# Patient Record
Sex: Male | Born: 1940 | Race: White | Hispanic: No | Marital: Single | Smoking: Current some day smoker
Health system: Southern US, Community
[De-identification: ages and names within clinical notes are randomized; demographics above are authoritative.]

## PROBLEM LIST (undated history)

## (undated) DIAGNOSIS — M1990 Unspecified osteoarthritis, unspecified site: Secondary | ICD-10-CM

## (undated) DIAGNOSIS — Z951 Presence of aortocoronary bypass graft: Secondary | ICD-10-CM

## (undated) DIAGNOSIS — Z9889 Other specified postprocedural states: Secondary | ICD-10-CM

## (undated) DIAGNOSIS — IMO0001 Reserved for inherently not codable concepts without codable children: Secondary | ICD-10-CM

## (undated) DIAGNOSIS — I639 Cerebral infarction, unspecified: Secondary | ICD-10-CM

## (undated) DIAGNOSIS — E785 Hyperlipidemia, unspecified: Secondary | ICD-10-CM

## (undated) DIAGNOSIS — K219 Gastro-esophageal reflux disease without esophagitis: Secondary | ICD-10-CM

## (undated) DIAGNOSIS — R0989 Other specified symptoms and signs involving the circulatory and respiratory systems: Secondary | ICD-10-CM

## (undated) DIAGNOSIS — I509 Heart failure, unspecified: Secondary | ICD-10-CM

## (undated) DIAGNOSIS — I10 Essential (primary) hypertension: Secondary | ICD-10-CM

## (undated) HISTORY — PX: TONSILLECTOMY: SUR1361

## (undated) HISTORY — DX: Essential (primary) hypertension: I10

## (undated) HISTORY — PX: HERNIA REPAIR: SHX51

## (undated) HISTORY — DX: Gastro-esophageal reflux disease without esophagitis: K21.9

## (undated) HISTORY — PX: MOUTH SURGERY: SHX715

## (undated) HISTORY — DX: Other specified symptoms and signs involving the circulatory and respiratory systems: R09.89

## (undated) HISTORY — DX: Hyperlipidemia, unspecified: E78.5

---

## 2005-01-24 ENCOUNTER — Emergency Department (HOSPITAL_COMMUNITY): Admission: EM | Admit: 2005-01-24 | Discharge: 2005-01-24 | Payer: Self-pay | Admitting: Emergency Medicine

## 2005-04-23 ENCOUNTER — Encounter: Admission: RE | Admit: 2005-04-23 | Discharge: 2005-04-23 | Payer: Self-pay | Admitting: Family Medicine

## 2005-04-28 ENCOUNTER — Encounter: Payer: Self-pay | Admitting: Emergency Medicine

## 2005-04-28 ENCOUNTER — Ambulatory Visit: Payer: Self-pay | Admitting: Physical Medicine & Rehabilitation

## 2005-04-28 ENCOUNTER — Inpatient Hospital Stay (HOSPITAL_COMMUNITY): Admission: EM | Admit: 2005-04-28 | Discharge: 2005-05-04 | Payer: Self-pay | Admitting: Emergency Medicine

## 2005-05-03 ENCOUNTER — Encounter (INDEPENDENT_AMBULATORY_CARE_PROVIDER_SITE_OTHER): Payer: Self-pay | Admitting: Cardiology

## 2006-03-18 ENCOUNTER — Emergency Department (HOSPITAL_COMMUNITY): Admission: EM | Admit: 2006-03-18 | Discharge: 2006-03-19 | Payer: Self-pay | Admitting: Emergency Medicine

## 2007-01-24 ENCOUNTER — Emergency Department (HOSPITAL_COMMUNITY): Admission: EM | Admit: 2007-01-24 | Discharge: 2007-01-24 | Payer: Self-pay | Admitting: Emergency Medicine

## 2007-06-16 ENCOUNTER — Inpatient Hospital Stay (HOSPITAL_COMMUNITY): Admission: EM | Admit: 2007-06-16 | Discharge: 2007-06-19 | Payer: Self-pay | Admitting: Emergency Medicine

## 2007-06-19 ENCOUNTER — Encounter (INDEPENDENT_AMBULATORY_CARE_PROVIDER_SITE_OTHER): Payer: Self-pay | Admitting: Internal Medicine

## 2007-08-11 ENCOUNTER — Emergency Department (HOSPITAL_COMMUNITY): Admission: EM | Admit: 2007-08-11 | Discharge: 2007-08-11 | Payer: Self-pay | Admitting: Emergency Medicine

## 2007-10-26 ENCOUNTER — Ambulatory Visit: Payer: Self-pay | Admitting: Internal Medicine

## 2007-11-13 ENCOUNTER — Ambulatory Visit: Payer: Self-pay

## 2010-11-10 NOTE — Letter (Signed)
October 26, 2007    Jethro Bastos, M.D.  [address]  [address]  [place], [place] [address]   RE:  Darren [name], Darren [name]  MRN:  [id number]  /  DOB:  April 24, 1941   Dear Nadine Counts:   I look forward to talking with you about Darren [name] and I trust I  will have done so by the time you get this letter.   It was a pleasure to see him today at your request because of recurrent  falls and syncope.   As you know, he is a 70 year old Caucasian male who has had a number of  strokes in the past, who has no known heart disease including a negative  echo from the fall of 2008, notable primarily for mild diastolic  dysfunction.   Over the last 2 or 3 years, he has had recurrent episodes that are quite  stereotypical.  They result in him falling, though he is rather adamant  that he does not lose consciousness.  He is unable to get up off the  ground for some time.  On each occasion, EMS has been called and his  blood pressure has been too low for a reading.  On one of these  occasions, we have the admission blood pressure from the hospital, which  was in the 80s; other ER blood pressures have been in the 120-160  systolic range.   The stereotypical prodrome is characterized primarily by funny in the  head.  It is accompanied by diaphoresis and flushing and some nausea.  It can last for minutes prior to his falling and it has never occurred  without him subsequently falling.  He is quite pale with these episodes  and as mentioned, he has residual orthostatic intolerance.   It is also interesting that the family recalls nothing from the EMS  reports.  There are no comment about an abnormal heart rate.   He does not have baseline orthostatic intolerance.  He does not have  shower intolerance.   He does have a significant problem with blood pressure over the years  and you have gradually down-titrated his blood pressure medications so  that they are now off and his blood pressures have been  remaining in  fairly good control.  He notes that he has had no recurrent symptoms in  3 months and thinks that this is largely attributable to your medication  adjustments.   As noted in your note, he has been treated for dehydration, although  there is no apparent reason for him to have been dehydrated.   PAST MEDICAL HISTORY:  Notable for the multiple strokes for which there  has been no specific diagnosis made and no cause identified.   In addition, his past medical history is notable for: (1) Kidney  problems, (2) urinary problems, (3) psoriasis, (4) disordered gait  attributed to his prior strokes.   PAST SURGICAL HISTORY:  Notable for herniorrhaphy x2 and tonsillectomy.   REVIEW OF SYSTEMS:  Otherwise broadly negative.   SOCIAL HISTORY:  He is a widower.  He has 2 children.  He does not use  cigarettes, having stopped a month ago.  He does not use alcohol or  recreational drugs.   He is retired from the [employer].  He also I should  note is not using caffeine.   MEDICATIONS:  1. Aspirin 81.  2. Ranitidine 300.  3. Simvastatin 40.   ALLERGIES:  He has no known drug allergies.  PHYSICAL EXAMINATION:  He is an older Caucasian male appearing  considerably older than his 66 years.  He was not orthostatic in the  office today with blood pressure of 109/80 with a pulse of 69 lying and  then the blood pressures actually went up to 132/85 with a pulse of 76  at 2 minutes with a subsequent fall to 111/79 with a pulse of 76 at 5  minutes.  The HEENT exam demonstrated no icterus or xanthomata.  There  was maybe a soft bruit on the right.  The back was without kyphosis or  scoliosis.  His lung sounds were markedly diminished.  There were no  rales.  Heart sounds were somewhat distant.  There was an S4.  There was  also a 2/6 murmur heard along the sternum.  The abdomen was soft with  active bowel sounds.  There was a midline pulsation that was mildly  broad.   There was no hepatomegaly.  Femoral pulses were 2+ without  bruits.  Distal pulses were trace.  There was no clubbing, cyanosis or  edema.  The neurological exam was notable for his wide-based gait, but  was otherwise normal.   His electrocardiogram dated from August 11, 2007 demonstrated sinus  rhythm at 58 with intervals of 0.15/0.08/0.42.  Another  electrocardiogram was not taken today.   IMPRESSION:  1. Recurrent falls plus or minus loss of consciousness associated with      documented recurrent low blood pressure without any evidence of      arrhythmia.  2. Prior strokes, question mechanism.  3. Peripheral vascular disease suggested by broad abdominal pulsation      and a right carotid bruit.  4. Modest exercise intolerance, presumably related to chronic      obstructive pulmonary disease.   Nadine Counts, Darren [name] has recurrent spells with a stereotypical prodrome  that are associated with hypotension and without evidence of a  persisting rhythm abnormality.  The suggestion then is that this is a  vasomotor episode without a defined trigger.  Potentially, this could be  an arrhythmia; it could be a number of other things.  The accompanying  symptoms and the persistent nature of it is not particularly helpful in  eliminating the possibility of an arrhythmic trigger.  However, since  you have eliminated his blood pressure medications, he is optimistic  that his symptoms have been addressed.  The fact that he has a 69-month  interlude of no symptoms, however, is not all that encouraging, as he  typically has had 5-7 months of interlude previously.  A tilt table test  would certainly identify him as having triggerable vasomotor  instability, but I do not think that would be as helpful as potentially  identifying the trigger itself.  In the event that he were to have  recurrent spells, I would probably recommend that we pursue an  implantable loop recorder, given the long interval  between spells.   I am also concerned about his vascular status.  His broad abdominal  pulsation and his carotid bruit suggest the presence of vascular disease  and certainly it would be an expected concomitant with his smoking,  which he thankfully has now stopped.  To that end, I have taken the  liberty of ordering an ultrasound of his abdomen as well as of his  carotids.   The issue of his strokes is also somewhat bothersome and given his age  and his hypertension, we are now using atrial fibrillation automatic  detection recorders to try to elucidate this as a  contributing factor, as Coumadin would be then indicated.  These are  monitors that automatically detect irregularity in R-R intervals and so  I have also chosen to set him up with that.  He and his daughter were  appreciative of the plan and I will forward the results of this to you.    Sincerely,      Darren Salvia, MD, [facility]  Electronically Signed    SCK/MedQ  DD: 10/26/2007  DT: 10/26/2007  Job #: [id number]

## 2010-11-10 NOTE — Discharge Summary (Signed)
Darren [name], Darren [name]           ACCOUNT NO.:  [id number]   MEDICAL RECORD NO.:  [id number]          PATIENT TYPE:  INP   LOCATION:  1413                         FACILITY:  [facility]   PHYSICIAN:  Ladell Pier, M.D.   DATE OF BIRTH:  1941/05/25   DATE OF ADMISSION:  06/16/2007  DATE OF DISCHARGE:  06/19/2007                               DISCHARGE SUMMARY   DISCHARGE DIAGNOSES:  1. Hypotension.  2. Tobacco use.  3. Renal insufficiency.  4. Leukocytosis.  5. Urinary urgency.  6. History of cerebral vascular accident with a gait abnormality, uses      a walker.  7. Nausea and vomiting that has resolved.  8. Status post coronary artery surgery x2.  9. Status post tonsillectomy.  10.Psoriasis.   DISCHARGE MEDICATIONS:  1. Lisinopril 10 mg daily.  2. Aspirin 325 mg daily.  3. Ranitidine 300 mg daily.  4. Simvastatin 80 mg half every night.   FOLLOW-UP APPOINTMENTS:  The patient to follow up with Dr. Dorothe Pea in 1-  2 weeks.   PROCEDURES:  None.   CONSULTANTS:  None.   HISTORY OF PRESENT ILLNESS:  The patient is a 70 year old white male  that presented to the ED after he had an episode where he got really  nauseous.  He felt sweaty.  He lost control of bowel function and felt  as if his blood pressure was dropping.  He got really lightheaded.  He  has had five of these episodes in September of this year and has been to  the emergency room several times and was given IV fluids and discharged  home.  He had no chest pain, no shortness of breath, no headache with  these episodes.   Past medical history, family history, social history, medications,  allergies, review of systems, per admission H&P.   PHYSICAL EXAMINATION ON DISCHARGE:  Temperature 97.5, pulse 58,  respirations 20, blood pressure 115/81, pulse oximetry 98% on room air.  HEENT:  Head is normocephalic, atraumatic.  Pupils reactive to light.  Throat without erythema.  CARDIOVASCULAR:  Regular rate and rhythm.  LUNGS:  Clear bilaterally.  ABDOMEN:  Positive bowel sounds.  EXTREMITIES:  Without edema.   HOSPITAL COURSE:  1. Hypotension:  The patient on admission was noted to be hypotensive.      He was given fluids in the emergency room.  Blood pressure on      admission was 89/61.  The patient's blood pressure and oxybutynin      were both held, and blood pressure was back to normal.  He had no      chest pain, no shortness of breath.  None of spells while he was in      the hospital.  No arrhythmia noted on telemetry.  The patient will      be discharged home to follow up with his primary care physician for      further evaluation.  On discharge, he had a 2-D echocardiogram that      was pending.  Also, he had 24 urine for metanephrine that was also      pending and free  metanephrine in the blood that was also pending.  2. Hypertension:  As mentioned above, the patient's antihypertensive      medication was halved, and the prostate medication was held.  His      symptoms could be secondary to the blood pressure dropping with      taking the blood pressure medication and the oxybutynin.  I      discontinued the medication, and daughter will monitor blood      pressure at home and bring results to PCP.  3. History of cerebral vascular accident:  No symptoms of new stroke      while he was inpatient.  He had physical therapy consult inpatient      and did well.   DISCHARGE LABORATORY DATA:  Chest x-ray showed no active cardiopulmonary  process.  Troponin was 0.01 x3.  CK 83, MB 3.3, CK 104, MB 3.6, CK 104,  MB 3.9.  Cortisol level of 18.6.  Blood culture was negative x2.  D-  dimer of 0.39.  TSH of 0.727.  BNP of less than 30.  Liver function  tests:  Bilirubin 0.5, alkaline phosphatase 33, AST 16, ALT 13.      Ladell Pier, M.D.  Electronically Signed     NJ/MEDQ  D:  06/19/2007  T:  06/20/2007  Job:  [id number]   cc:   Jethro Bastos, M.D.  Fax: [phone]

## 2010-11-10 NOTE — H&P (Signed)
Darren [name], Darren [name]           ACCOUNT NO.:  [id number]   MEDICAL RECORD NO.:  [id number]          PATIENT TYPE:  INP   LOCATION:  1413                         FACILITY:  [facility]   PHYSICIAN:  Ladell Pier, M.D.   DATE OF BIRTH:  [birth year]   DATE OF ADMISSION:  06/16/2007  DATE OF DISCHARGE:                              HISTORY & PHYSICAL   CHIEF COMPLAINT:  Hypertension with near-syncopal episode.   HISTORY OF PRESENT ILLNESS:  The patient is a 69 year old white male  that presents to the ED after he had an episode where he gets nauseous.  He feels sweaty, he loses control of bowel function and feels as if his  blood pressure is dropping, lightheaded.  He has had 5 of these episodes  in September of this year and has been to the emergency room several  times and was given IV fluids and discharged.  He has no chest pain, no  increased shortness of breath, no headaches with these episodes.   PAST MEDICAL HISTORY:  Significant for acute stroke back in November  2006 where he was hospitalized.  He also had a past medical history  positive for tobacco use, hypertension, urinary hesitancy, history of  psoriasis, history of hernia surgery x2, status post tonsillectomy.   FAMILY HISTORY:  Mother and father both deceased.  Mother died from  Alzheimer's, father died of emphysema.   SOCIAL HISTORY:  He is widowed, has two children.  He is retired from  [employer], smokes a pack of cigarettes last 3 months, no alcohol  use.   MEDICATIONS:  He takes lisinopril 20 mg half daily, oxybutynin 5 mg, 1-  1/2 twice daily, ranitidine 200 mg daily, Zocor 80 mg half at bedtime,  aspirin 325 mg daily.   ALLERGIES:  None.   REVIEW OF SYSTEMS:  As per stated in the HPI, otherwise negative.   PHYSICAL EXAM:  VITAL SIGNS:  Temperature 97.6, blood pressure 89/61,  pulse of 57, respirations 18, pulse ox 99% on room air.  HEENT:  Head is normocephalic, atraumatic.  Pupils equal, round,  reactive  to light.  Throat without erythema.  CARDIOVASCULAR:  Regular  rate and rhythm.  LUNGS:  Clear bilaterally.  ABDOMEN:  Positive bowel sounds.  EXTREMITIES:  No edema.   LABS:  WBC 14.1, hemoglobin 13, platelets 409, MCV of 92.8, sodium 138,  potassium 4.6, chloride 106, CO2 25, glucose 134, BUN 26, creatinine  1.48, AST 16, albumin 3.3.  Urinalysis normal.  EKG shows sinus brady.   ASSESSMENT/PLAN:  1. Hypotension:  Not sure of the etiology of his hypotension.  Will      rule out all the possible causes, with his episodes where he feels      was flushed, hot and sweaty, nausea, diarrhea and weakness.  Will      rule out pheochromocytoma, with checking urine for metanephrines.      Will also rule out PE as a cause of the hypotension.  Will check      his a.m. free cortisol level.  Will get a chest x-ray with his  tobacco history.  Will also hold his medications, the oxybutynin      and lisinopril.  2. Tobacco abuse, put on smoking cessation.  3. Renal insufficiency.  Will monitor his kidney function.  4. Leukocytosis.  Not sure of the etiology of his elevated white cell      count.  Could be stress reaction but will check his blood cultures      x2.  Chest x-ray, urine is normal.      Ladell Pier, M.D.  Electronically Signed     NJ/MEDQ  D:  06/16/2007  T:  06/17/2007  Job:  [id number]

## 2010-11-13 NOTE — Discharge Summary (Signed)
Darren [name], Darren [name]           ACCOUNT NO.:  [id number]   MEDICAL RECORD NO.:  [id number]          PATIENT TYPE:  INP   LOCATION:  3027                         FACILITY:  [facility]   PHYSICIAN:  Jackie Plum, M.D.DATE OF BIRTH:  1940/08/31   DATE OF ADMISSION:  04/28/2005  DATE OF DISCHARGE:                                 DISCHARGE SUMMARY   DISCHARGE DIAGNOSES:  1.  Acute cerebrovascular accident.      1.  MRI of the brain indicated acute infarction in the right pons and          left frontal periventricular white matter. Generalized atrophy and          extensive chronic small vessel ischemia. MRA indicated scattered          atherosclerotic disease, particularly in the right posterior          cerebral and right middle cerebral artery; no aneurysm or large          vessel occlusion.      2.  Carotid Doppler, 2-D echocardiogram, and fasting lipid panel pending          at time of this dictation.      3.  The patient is a cigarette smoker, has been counseled, and yet to be          seen by cigarette cessation team regarding desisting from smoking          cigarettes.      4.  History of hypertension, on lisinopril.  2.  Ambulatory/gait disturbance with fall, deemed secondary to diagnosis #1.  3.  History of renal insufficiency, psoriasis, hypertension, and tobacco      abuse.  4.  Bilateral renal cysts without worrisome characteristics. Ultrasound of      the kidneys done on April 29, 2005.  5.  Mild hyponatremia with sodium of 133 on May 01, 2005, deemed      secondary to hypotonic IV fluid administration on admission.   DISCHARGE LABORATORY DATA:  Last sodium of May 01, 2005, indicated  sodium of 133, potassium 4.4, chloride 106, CO2 22, glucose 88, BUN 12,  creatinine 1.4. Discharge laboratories to be updated at the final discharge  time by the discharging physician.   MEDICATIONS AT DISCHARGE:  Will be completed at time of final discharge.   DISPOSITION:   Will be completed at time of final discharge.   CONSULTANTS:  The patient had been seen and was being followed by Dr.  Sharene Skeans. Dr. Delia Heady of the stroke team has been asked to evaluate the  patient today.   REASON FOR ADMISSION:  Fall.   The patient is a 70 year old gentleman who presented with a 3-week history  of progressive weakness with fall and urinary incontinence. The patient was  said to have been dragging his left foot and had been falling to the left  side. Outpatient lumbar MRI was done which showed degenerative disc disease  without significant neural compression and incidental bilateral renal cystic  disease was noted. The cystic disease was subsequently followed up with  ultrasound in the hospital as noted above.  The patient had a fall the night  before admission and came to the ED whereupon CT scan of the head showed no  acute event. The patient had been scheduled to see Dr. Orlin Hilding at the  outpatient level, and therefore the ED physician contacted Dr. Sharene Skeans who  was on call for neurology, and he recommended that the patient be admitted  to the hospitalist service and for neurology to consult with the concern  being possible cervical myelopathy. On admission, the patient's vital signs  were stable and neurologic exam indicated that the patient was alert and  oriented x3. His cranial nerves were said to be intact, and he had possibly  slightly weak motor function on the right lower extremity. He had  hyperreflexia bilaterally. His __________ was said to be negative and lab  work indicated hemoglobin of 12, hematocrit of 35, platelet count of 532.  Potassium of 3.2, sodium 131, BUN 26, creatinine 2.0. He was therefore  admitted for further evaluation. On admission, the patient had MRI of the C  spine which was completed on April 29, 2005. This indicated slightly  atrophic cervical spine cord without focal signal abnormality. There was  mild degenerative changes  throughout his cervical spine, especially at the  C5-6 level where there was mild spinal stenosis and bilateral foraminal  narrowing. On account of the fact that this could not explain the patient's  presentation, MRI of the brain was done which showed acute infarction as  noted above. The patient has yet to be followed by neurology service. Stroke  workup, as noted above, has been ordered and pending. This dictation is  therefore being done as an interim discharge summary in anticipation of  discharge pretty soon after completion of stroke evaluation. The patient had  been seen by PT/OT and they recommended home health PT/OT versus rehab  consult. Rehab consult has been placed and the patient has yet to be seen by  rehab medicine. On rounds today, the patient denies any fever, no chills. He  has been ambulating to the bathroom with walker without any significant  problems. His blood pressure was 128/86, pulse 56, respirations 20,  temperature 98.5 degrees Fahrenheit, pulse oximetry was 97% on room air. He  feels fine, he is not in any acute distress. His cardiopulmonary exam was  unremarkable. Abdomen is soft, nontender; extremity, no cyanosis. He is  appropriate, oriented x3. The patient's IV fluid of D-5-normal saline has  been discontinued in view of diagnosis of stroke and switched to normal  saline at this time. I called Annie Main of stroke team and they are going  to take a look at the patient. Hopefully we will get all the workup  completed by the end of today and possibly discharge tomorrow or pretty soon  thereafter. The patient will be seen by one of my partners tomorrow, at what  time appropriateness of discharge will be evaluated and if so, addendum made  to this discharge summary.      Jackie Plum, M.D.  Electronically Signed     GO/MEDQ  D:  05/03/2005  T:  05/03/2005  Job:  [id number]

## 2010-11-13 NOTE — H&P (Signed)
Darren [name], Darren [name]           ACCOUNT NO.:  [id number]   MEDICAL RECORD NO.:  [id number]          PATIENT TYPE:  INP   LOCATION:  1823                         FACILITY:  [facility]   PHYSICIAN:  Corinna L. Lendell Caprice, MDDATE OF BIRTH:  03/04/1941   DATE OF ADMISSION:  04/28/2005  DATE OF DISCHARGE:                                HISTORY & PHYSICAL   CHIEF COMPLAINT:  Fall.   HISTORY OF PRESENT ILLNESS:  Darren [name] is a pleasant 70 year old white  male who has had progressive weakness over the past three weeks.  He fell  last night.  He has been incontinent of urine.  Several times over the past  few weeks his daughter notes that he has been dragging his left foot and  always seems to be falling to the left side.  He had a lumbar MRI as an  outpatient which showed degenerative disk disease without significant neural  compression, bilateral renal cystic disease.  Recommend ultrasound for  further evaluation and moderate atheromatous changes in the aorta without  aneurysmal dilatation.  He presented to the emergency room today with his  daughter, after having fallen last night.  CAT scan of the head showed  nothing acute.  The patient was scheduled to see Dr. Orlin Hilding yesterday and  Dr. Sharene Skeans was called by the ER physician.  Dr. Sharene Skeans requested that  the patient be transferred to [facility] in case neurosurgery was  needed.  He asked that I admit the patient and he could consult.  The  patient has lost about 6 pounds over the past week.  His appetite has been  poor.  He has no neck pain.  No history of recent trauma.   PAST MEDICAL HISTORY:  1.  Recent diagnosis of hypertension.  2.  Recent diagnosis of renal insufficiency.  Creatinine in July was 1.4.  3.  Tobacco abuse.  4.  Psoriasis.   MEDICATIONS:  1.  Lidex cream as needed.  2.  Lisinopril 20 mg p.o. daily.   SOCIAL HISTORY:  The patient smokes a half a pack of cigarettes a day.  He  lives with his daughter  who is here now.  He does not drink or use drugs.   FAMILY HISTORY:  His sister died of stomach cancer.  His mother died of  Alzheimer's.   REVIEW OF SYSTEMS:  As above otherwise negative.   PHYSICAL EXAMINATION:  VITAL SIGNS:  Temperature is 97.9, blood pressure  1120/88, pulse 66, respiratory rate 16, oxygen saturation 96% on room air.  GENERAL:  The patient is well nourished, well developed in no acute  distress.  HEENT:  Normocephalic, atraumatic.  Pupils are equal, round, and reactive to  light.  Sclerae are nonicteric.  Moist mucous membranes.  NECK:  Supple.  No lymphadenopathy.  No thyromegaly.  LUNGS:  Clear to auscultation bilaterally without wheezes, rhonchi, or  rales.  CARDIOVASCULAR:  Regular rate and rhythm without murmurs, gallops, or rubs.  ABDOMEN:  Normal bowel sounds.  Soft, nontender, nondistended.  GU:  Deferred.  RECTAL:  Deferred.  EXTREMITIES:  No clubbing, cyanosis, or edema.  NEUROLOGIC:  He is alert and oriented x3.  Cranial nerves are intact.  Upper  motor strength is 5/5.  Lower extremity strength is possibly slightly weaker  on the right.  He had intact sensation.  Hyper-reflexive bilaterally.  Babinski negative.  Romberg positive per Dr. Sharene Skeans.  I did not test his  gait or Romberg.  SKIN:  He has silver scaly plaque on an erythematous base on his left elbow.   LABORATORY:  CBC significant for a hemoglobin of 12, hematocrit 35, platelet  count 532, otherwise unremarkable.  Complete metabolic panel is significant  for a potassium of 3.2, sodium 131, BUN 26, creatinine 2.0, albumin 3.0,  otherwise unremarkable.  UA shows trace ketones, negative blood, 100  protein, negative nitrites, negative leukocyte esterase.  CAT scan of the  brain shows nothing acute, chronic small vessel ischemic disease with  atrophy, mild left maxillary sinus disease.  MRI of the L spine as above.  Chest x-ray, on April 23, 2005, showed nothing active.    ASSESSMENT/PLAN:  1.  Weakness, fall, ataxia with urinary incontinence, question cervical      myelopathy.  I await MRI scan.  Dr. Sharene Skeans has already seen the      patient and also ordered B-12, folic acid, RPR, TSH, ESR, and CPK.  2.  Hypertension.  Continue Lisinopril.  3.  Chronic renal insufficiency.  4.  Tobacco abuse, counseled against.  5.  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  I will go ahead and order an ultrasound.  6.  Hypokalemia.  I will replete this orally.      Corinna L. Lendell Caprice, MD  Electronically Signed     CLS/MEDQ  D:  04/28/2005  T:  04/28/2005  Job:  [id number]   cc:   Jethro Bastos, M.D.  Fax: [phone]

## 2010-11-13 NOTE — Consult Note (Signed)
Darren [name], Darren [name]           ACCOUNT NO.:  [id number]   MEDICAL RECORD NO.:  [id number]          PATIENT TYPE:  INP   LOCATION:  3027                         FACILITY:  [facility]   PHYSICIAN:  Deanna Artis. Hickling, M.D.DATE OF BIRTH:  1940/11/30   DATE OF CONSULTATION:  04/28/2005  DATE OF DISCHARGE:                                   CONSULTATION   CHIEF COMPLAINT:  I can not walk as well as I could.  I fell last night.  I  do not remember falling.   HISTORY OF PRESENT ILLNESS:  The patient is a 70 year old right-handed  Caucasian single male who lives with his daughter.  He has a two-week  history of progressive gait disorder evaluated twice last week by his  primary physician.  Lumbar spine MRI scan on April 23, 2005 showed mild  spondylosis but no compression of the spinal cord or the nerve roots.  CT  scan of the brain today showed mild atrophy but no focal abnormalities,  remote or acute.  The patient has had urinary incontinence.  He fell last  night in the bathroom and does not recall the fall.  His son-in-law heard  two separate noises and found the patient on the floor, conscious but unable  to comprehend why he had fallen.   The patient was seen at the [facility] emergency room around 2 o'clock  today, and evaluation was started.  I was contacted around 5 p.m. and  recommended transfer to [facility] in case neurosurgery needed to  be consulted for a mechanical issue requiring decompression.   REVIEW OF SYSTEMS:  Remarkable for decreased appetite with weight loss.  The  patient has not had fever, nausea, vomiting, diarrhea, symptoms of upper or  lower respiratory distress, pain, significant arthritis, diabetes mellitus,  heart disease.  The patient had abnormal potassium and calcium last month.  No history of seizures.  No prior weakness.  No other neurologic complaints.  12-system review otherwise negative.   PAST SURGICAL HISTORY:  None.   MEDICATIONS:  1.  Lisinopril 20 mg daily.  2.  Fluocinamide 0.05% as needed.   ALLERGIES:  NO KNOWN DRUG ALLERGIES.   FAMILY HISTORY:  Father died of emphysema.  Mother died of Alzheimer's.  The  patient has two living siblings who are well.   SOCIAL HISTORY:  The patient smokes a half pack of cigarettes per day.  He  denies alcohol.  He is not working.  He lives with his daughter.   PHYSICAL EXAMINATION:  VITAL SIGNS:  Blood pressure 120/88, resting pulse  66, respirations 16, temperature 97.9, oxygen saturation 100%.  NECK:  Supple.  Full range of motion.  No bruits.  LUNGS:  Clear.  HEART:  No murmurs.  Pulses normal.  ABDOMEN:  Soft.  Bowel sounds normal.  EXTREMITIES:  Negative.  NEUROLOGIC:  The patient was awake and alert.  Cranial nerves:  Round  reactive pupils.  Visual fields full.  Extraocular movements full.  Symmetric facial strength.  Midline tongue and uvular.  Air conduction  greater than bone conduction.  Motor examination:  Normal strength in  the  upper extremities, some atrophy in the hands, no fasciculations seen.  Lower  extremities with 4/5 hip flexors, 5/5 distally.  Fine motor movements upper  extremities and lower extremities seem to be okay.  Sensory examination:  No  level.  Sensory level was seen.  The patient has a stocking neuropathy.  The  patient has good stereognosis in the upper extremities, good proprioception  and vibratory sensation in the lower and upper extremities.  Gait is  spastic, left greater than right, ataxic.  He can get up on his toes better  than his heels.  He has a positive Romberg.  Deep tendon reflexes are brisk.  He has no clonus.  He had bilateral extensor plantar responses.   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  We need  to evaluate for cervical spondylosis or other mechanical compression such as  tumor, intra or extra-axial.  We need to look for infectious, metabolic, and  hormonal causes for myelopathy to  myelating disease, vascular disease.   PLAN:  MRI of the cervical spine, laboratories to look for other causes.  The patient needs a physical therapy consult.  I appreciate the opportunity  to participate in his care.  I will continue to follow along with you.   LABORATORY DATA:  Thus far, urinalysis revealed a specific gravity of 1.019,  pH 6.5.  Chemistries were all negative.  0-2 red blood cells.  Sodium 131,  potassium 3.2, chloride 96, CO2 24, glucose 107, BUN 26, creatinine elevated  at 2.0, calcium 8.6, total protein 6.7, albumin 3.0.  SGPT 15, SGOT 23,  alkaline phosphatase 36, total bilirubin 0.8.  White blood cell count 9500,  hemoglobin 12.1, hematocrit 35.3, MCV 90.0, platelet count 532,000, 78 polys  (absolute granulocyte 7400), 16% lymphs, 5% monos, 1% eosinophils.   The patient has an EKG which showed a regular sinus rhythm with some ST-T  wave abnormalities that were nonspecific.   I appreciate the opportunity to participate in the care of this patient.  If  you have questions or if I can be of assistance, do not hesitate to contact  me.      Deanna Artis. Sharene Skeans, M.D.  Electronically Signed     WHH/MEDQ  D:  04/28/2005  T:  04/29/2005  Job:  [id number]   cc:   Jethro Bastos, M.D.  Fax: [phone]   Corinna L. Lendell Caprice, MD

## 2010-11-13 NOTE — Procedures (Signed)
EEG NUMBER:  [id number]   HISTORY:  This is a 70 year old patient who is being evaluated for  progressive gait disorder.  Patient is having increased problems on the left  side.  This is a routine EEG.  No skull defects are noted.  Medications are  not listed.   EEG CLASSIFICATION:  Normal awake and drowsy.   DESCRIPTION OF RECORDING:  Background rhythm of this recording consists of  fairly well modulated medium amplitude alpha rhythm of 8 Hz that is reactive  to eye opening and closure.  As the record progresses, patient appears to at  times be in the drowsy state with a 7 Hz background slowing seen.  Patient  never enters stage II sleep at any time.  As the record progresses, patient  does alert towards the end of recording.  Photic stimulation is performed  resulting in a bilateral and symmetric photic driving response.  Hyperventilation is never performed.  At no time during the recording does  there appear to be evidence of spike, spike wave discharges or evidence of  focal slowing.  EKG monitor shows no evidence of cardiac rhythm  abnormalities with a heart rate of 72.   IMPRESSION:  This is a normal EEG recording in the awake and drowsy state.  No evidence of ictal or interictal discharges are seen.      Marlan Palau, M.D.  Electronically Signed     [id number]  D:  04/29/2005 15:14:57  T:  04/29/2005 20:54:50  Job #:  [id number]

## 2011-03-19 LAB — URINALYSIS, ROUTINE W REFLEX MICROSCOPIC
Bilirubin Urine: NEGATIVE
Glucose, UA: 250 — AB
Hgb urine dipstick: NEGATIVE
Ketones, ur: 15 — AB
Nitrite: NEGATIVE
Protein, ur: NEGATIVE
Specific Gravity, Urine: 1.025
Urobilinogen, UA: 1
pH: 6

## 2011-03-19 LAB — I-STAT 8, (EC8 V) (CONVERTED LAB)
Acid-base deficit: 2
BUN: 21
Bicarbonate: 24
Chloride: 103
Glucose, Bld: 172 — ABNORMAL HIGH
HCT: 42
Hemoglobin: 14.3
Operator id: 192351
Potassium: 4.3
Sodium: 136
TCO2: 25
pCO2, Ven: 46.4
pH, Ven: 7.322 — ABNORMAL HIGH

## 2011-03-19 LAB — CBC
HCT: 39
Hemoglobin: 13.2
MCHC: 33.7
MCV: 94.9
Platelets: 378
RBC: 4.11 — ABNORMAL LOW
RDW: 14.9
WBC: 10.5

## 2011-03-19 LAB — DIFFERENTIAL
Basophils Absolute: 0
Basophils Relative: 0
Eosinophils Absolute: 0.2
Eosinophils Relative: 2
Lymphocytes Relative: 9 — ABNORMAL LOW
Lymphs Abs: 0.9
Monocytes Absolute: 0.4
Monocytes Relative: 4
Neutro Abs: 9 — ABNORMAL HIGH
Neutrophils Relative %: 85 — ABNORMAL HIGH

## 2011-03-19 LAB — POCT I-STAT CREATININE
Creatinine, Ser: 1.5
Operator id: 192351

## 2011-03-19 LAB — POCT CARDIAC MARKERS
CKMB, poc: 4.6
Myoglobin, poc: 98.9
Operator id: 192351
Troponin i, poc: <0.05

## 2011-04-02 LAB — CBC
HCT: 34.6 — ABNORMAL LOW
HCT: 37.6 — ABNORMAL LOW
Hemoglobin: 13
MCHC: 34.5
MCHC: 34.7
MCV: 92.7
MCV: 92.8
Platelets: 370
Platelets: 409 — ABNORMAL HIGH
RBC: 3.73 — ABNORMAL LOW
RBC: 3.76 — ABNORMAL LOW
RBC: 4.05 — ABNORMAL LOW
RDW: 15.1
WBC: 14.1 — ABNORMAL HIGH
WBC: 6.3
WBC: 6.3

## 2011-04-02 LAB — DIFFERENTIAL
Basophils Absolute: 0
Basophils Relative: 0
Eosinophils Absolute: 0.1 — ABNORMAL LOW
Eosinophils Relative: 0
Lymphocytes Relative: 7 — ABNORMAL LOW
Lymphs Abs: 1
Monocytes Absolute: 0.5
Monocytes Relative: 4
Neutro Abs: 12.4 — ABNORMAL HIGH
Neutrophils Relative %: 88 — ABNORMAL HIGH

## 2011-04-02 LAB — URINALYSIS, ROUTINE W REFLEX MICROSCOPIC
Bilirubin Urine: NEGATIVE
Glucose, UA: NEGATIVE
Hgb urine dipstick: NEGATIVE
Ketones, ur: NEGATIVE
Nitrite: NEGATIVE
Protein, ur: NEGATIVE
Specific Gravity, Urine: 1.018
Urobilinogen, UA: 1
pH: 6.5

## 2011-04-02 LAB — COMPREHENSIVE METABOLIC PANEL
ALT: 13
AST: 16
Albumin: 3.3 — ABNORMAL LOW
Alkaline Phosphatase: 33 — ABNORMAL LOW
BUN: 16
BUN: 26 — ABNORMAL HIGH
CO2: 21
CO2: 25
Calcium: 8.9
Chloride: 106
Chloride: 110
Creatinine, Ser: 1.27
Creatinine, Ser: 1.48
GFR calc Af Amer: 58 — ABNORMAL LOW
GFR calc non Af Amer: 48 — ABNORMAL LOW
GFR calc non Af Amer: 57 — ABNORMAL LOW
Glucose, Bld: 134 — ABNORMAL HIGH
Potassium: 4.6
Sodium: 138
Total Bilirubin: 0.5
Total Bilirubin: 0.8
Total Protein: 6

## 2011-04-02 LAB — TROPONIN I
Troponin I: 0.01
Troponin I: 0.02
Troponin I: 0.03

## 2011-04-02 LAB — CATECHOLAMINES, FRACTIONATED, URINE, 24 HOUR
Epinephrine 24 Hr Urine: 3 ug/24hr (ref <20)
Norepinephrine 24 Hr Urine: 36 ug/24hr (ref <80)
Time-CATEUR: 24

## 2011-04-02 LAB — BASIC METABOLIC PANEL
Calcium: 9.1
Creatinine, Ser: 1.5
GFR calc Af Amer: 57 — ABNORMAL LOW

## 2011-04-02 LAB — CK TOTAL AND CKMB (NOT AT ARMC)
CK, MB: 3.3
CK, MB: 3.9
Relative Index: INVALID
Total CK: 104
Total CK: 104

## 2011-04-02 LAB — CULTURE, BLOOD (ROUTINE X 2): Culture: NO GROWTH

## 2011-04-02 LAB — METANEPHRINES, URINE, 24 HOUR: Volume, Urine-METAN: 2550

## 2011-04-02 LAB — METANEPHRINES, PLASMA: Normetanephrine, Free: 0.65 (ref <0.90)

## 2011-04-02 LAB — POCT CARDIAC MARKERS
CKMB, poc: 2.9
CKMB, poc: 2.9
Myoglobin, poc: 102
Myoglobin, poc: 115
Operator id: 4001
Operator id: 4001
Troponin i, poc: <0.05
Troponin i, poc: <0.05

## 2011-04-02 LAB — D-DIMER, QUANTITATIVE: D-Dimer, Quant: 0.39

## 2011-04-12 LAB — I-STAT 8, (EC8 V) (CONVERTED LAB)
Bicarbonate: 15.4 — ABNORMAL LOW
Glucose, Bld: 195 — ABNORMAL HIGH
Hemoglobin: 15
Sodium: 134 — ABNORMAL LOW
TCO2: 16
pCO2, Ven: 20.1 — ABNORMAL LOW

## 2014-04-08 ENCOUNTER — Encounter: Payer: Self-pay | Admitting: *Deleted

## 2014-07-29 DIAGNOSIS — I509 Heart failure, unspecified: Secondary | ICD-10-CM

## 2014-07-29 HISTORY — DX: Heart failure, unspecified: I50.9

## 2014-08-02 ENCOUNTER — Other Ambulatory Visit: Payer: Self-pay | Admitting: Orthopedic Surgery

## 2014-08-06 ENCOUNTER — Encounter (HOSPITAL_COMMUNITY): Payer: Self-pay | Admitting: *Deleted

## 2014-08-06 MED ORDER — CEFAZOLIN SODIUM-DEXTROSE 2-3 GM-% IV SOLR
2.0000 g | INTRAVENOUS | Status: AC
  Administered 2014-08-07: 2 g via INTRAVENOUS
  Filled 2014-08-06: qty 50

## 2014-08-06 NOTE — Progress Notes (Signed)
Patient's daughter Astrid DraftsRhonda Miller reports that she nor he is aware of carotid bruitt. "He doesn't go to the doctor often, just the doctor with his back."

## 2014-08-07 ENCOUNTER — Inpatient Hospital Stay (HOSPITAL_COMMUNITY): Payer: Medicare Other | Admitting: Anesthesiology

## 2014-08-07 ENCOUNTER — Inpatient Hospital Stay (HOSPITAL_COMMUNITY)
Admission: RE | Admit: 2014-08-07 | Discharge: 2014-08-08 | DRG: 517 | Disposition: A | Discharge status: Home / Self Care | Payer: Medicare Other | Source: Ambulatory Visit | Attending: Orthopedic Surgery | Admitting: Orthopedic Surgery

## 2014-08-07 ENCOUNTER — Inpatient Hospital Stay (HOSPITAL_COMMUNITY): Payer: Medicare Other

## 2014-08-07 ENCOUNTER — Encounter (HOSPITAL_COMMUNITY)
Admission: RE | Disposition: A | Discharge status: Home / Self Care | Payer: Self-pay | Source: Ambulatory Visit | Attending: Orthopedic Surgery

## 2014-08-07 ENCOUNTER — Encounter (HOSPITAL_COMMUNITY): Payer: Self-pay | Admitting: *Deleted

## 2014-08-07 DIAGNOSIS — E785 Hyperlipidemia, unspecified: Secondary | ICD-10-CM | POA: Diagnosis present

## 2014-08-07 DIAGNOSIS — R0989 Other specified symptoms and signs involving the circulatory and respiratory systems: Secondary | ICD-10-CM | POA: Diagnosis present

## 2014-08-07 DIAGNOSIS — Z8673 Personal history of transient ischemic attack (TIA), and cerebral infarction without residual deficits: Secondary | ICD-10-CM

## 2014-08-07 DIAGNOSIS — M1990 Unspecified osteoarthritis, unspecified site: Secondary | ICD-10-CM | POA: Diagnosis present

## 2014-08-07 DIAGNOSIS — M4856XA Collapsed vertebra, not elsewhere classified, lumbar region, initial encounter for fracture: Secondary | ICD-10-CM | POA: Diagnosis present

## 2014-08-07 DIAGNOSIS — Z720 Tobacco use: Secondary | ICD-10-CM

## 2014-08-07 DIAGNOSIS — IMO0002 Reserved for concepts with insufficient information to code with codable children: Secondary | ICD-10-CM

## 2014-08-07 DIAGNOSIS — R0609 Other forms of dyspnea: Secondary | ICD-10-CM | POA: Diagnosis present

## 2014-08-07 DIAGNOSIS — I10 Essential (primary) hypertension: Secondary | ICD-10-CM | POA: Diagnosis present

## 2014-08-07 DIAGNOSIS — K219 Gastro-esophageal reflux disease without esophagitis: Secondary | ICD-10-CM | POA: Diagnosis present

## 2014-08-07 DIAGNOSIS — Z01818 Encounter for other preprocedural examination: Secondary | ICD-10-CM

## 2014-08-07 DIAGNOSIS — Z79899 Other long term (current) drug therapy: Secondary | ICD-10-CM | POA: Diagnosis not present

## 2014-08-07 DIAGNOSIS — Z7982 Long term (current) use of aspirin: Secondary | ICD-10-CM

## 2014-08-07 DIAGNOSIS — Z419 Encounter for procedure for purposes other than remedying health state, unspecified: Secondary | ICD-10-CM

## 2014-08-07 HISTORY — DX: Reserved for inherently not codable concepts without codable children: IMO0001

## 2014-08-07 HISTORY — PX: KYPHOPLASTY: SHX5884

## 2014-08-07 HISTORY — DX: Unspecified osteoarthritis, unspecified site: M19.90

## 2014-08-07 HISTORY — DX: Cerebral infarction, unspecified: I63.9

## 2014-08-07 LAB — CBC WITH DIFFERENTIAL/PLATELET
BASOS ABS: 0 10*3/uL (ref 0.0–0.1)
Basophils Relative: 0 % (ref 0–1)
EOS PCT: 0 % (ref 0–5)
Eosinophils Absolute: 0 10*3/uL (ref 0.0–0.7)
HCT: 41.3 % (ref 39.0–52.0)
HEMOGLOBIN: 14.2 g/dL (ref 13.0–17.0)
LYMPHS PCT: 18 % (ref 12–46)
Lymphs Abs: 1.1 10*3/uL (ref 0.7–4.0)
MCH: 31.8 pg (ref 26.0–34.0)
MCHC: 34.4 g/dL (ref 30.0–36.0)
MCV: 92.4 fL (ref 78.0–100.0)
MONO ABS: 0.5 10*3/uL (ref 0.1–1.0)
MONOS PCT: 7 % (ref 3–12)
Neutro Abs: 4.8 10*3/uL (ref 1.7–7.7)
Neutrophils Relative %: 75 % (ref 43–77)
Platelets: 284 10*3/uL (ref 150–400)
RBC: 4.47 MIL/uL (ref 4.22–5.81)
RDW: 15.4 % (ref 11.5–15.5)
WBC: 6.5 10*3/uL (ref 4.0–10.5)

## 2014-08-07 LAB — COMPREHENSIVE METABOLIC PANEL
ALT: 80 U/L — ABNORMAL HIGH (ref 0–53)
ANION GAP: 12 (ref 5–15)
AST: 59 U/L — ABNORMAL HIGH (ref 0–37)
Albumin: 3.2 g/dL — ABNORMAL LOW (ref 3.5–5.2)
Alkaline Phosphatase: 72 U/L (ref 39–117)
BILIRUBIN TOTAL: 0.8 mg/dL (ref 0.3–1.2)
BUN: 29 mg/dL — AB (ref 6–23)
CO2: 19 mmol/L (ref 19–32)
Calcium: 9.1 mg/dL (ref 8.4–10.5)
Chloride: 107 mmol/L (ref 96–112)
Creatinine, Ser: 1.62 mg/dL — ABNORMAL HIGH (ref 0.50–1.35)
GFR calc Af Amer: 47 mL/min — ABNORMAL LOW (ref >90)
GFR calc non Af Amer: 40 mL/min — ABNORMAL LOW (ref >90)
GLUCOSE: 105 mg/dL — AB (ref 70–99)
Potassium: 4.1 mmol/L (ref 3.5–5.1)
Sodium: 138 mmol/L (ref 135–145)
TOTAL PROTEIN: 6.1 g/dL (ref 6.0–8.3)

## 2014-08-07 LAB — ABO/RH: ABO/RH(D): A POS

## 2014-08-07 LAB — APTT: aPTT: 29 seconds (ref 24–37)

## 2014-08-07 LAB — PROTIME-INR
INR: 1.13 (ref 0.00–1.49)
Prothrombin Time: 14.7 seconds (ref 11.6–15.2)

## 2014-08-07 LAB — TYPE AND SCREEN
ABO/RH(D): A POS
Antibody Screen: NEGATIVE

## 2014-08-07 LAB — SURGICAL PCR SCREEN
MRSA, PCR: NEGATIVE
STAPHYLOCOCCUS AUREUS: POSITIVE — AB

## 2014-08-07 SURGERY — KYPHOPLASTY
Anesthesia: General | Site: Back

## 2014-08-07 MED ORDER — HYDROMORPHONE HCL 1 MG/ML IJ SOLN: 0.2500 mg | INTRAMUSCULAR | Status: DC | PRN

## 2014-08-07 MED ORDER — MUPIROCIN 2 % EX OINT
1.0000 "application " | TOPICAL_OINTMENT | Freq: Two times a day (BID) | CUTANEOUS | Status: DC
  Administered 2014-08-07: 1 via NASAL

## 2014-08-07 MED ORDER — DOCUSATE SODIUM 100 MG PO CAPS
100.0000 mg | ORAL_CAPSULE | Freq: Two times a day (BID) | ORAL | Status: DC
  Administered 2014-08-07: 100 mg via ORAL
  Filled 2014-08-07: qty 1

## 2014-08-07 MED ORDER — CEFAZOLIN SODIUM 1-5 GM-% IV SOLN
1.0000 g | Freq: Three times a day (TID) | INTRAVENOUS | Status: AC
  Administered 2014-08-07 – 2014-08-08 (×2): 1 g via INTRAVENOUS
  Filled 2014-08-07 (×2): qty 50

## 2014-08-07 MED ORDER — PHENOL 1.4 % MT LIQD: 1.0000 | OROMUCOSAL | Status: DC | PRN

## 2014-08-07 MED ORDER — IOHEXOL 300 MG/ML  SOLN
INTRAMUSCULAR | Status: DC | PRN
  Administered 2014-08-07: 40 mL via INTRAVENOUS

## 2014-08-07 MED ORDER — ONDANSETRON HCL 4 MG/2ML IJ SOLN
4.0000 mg | INTRAMUSCULAR | Status: DC | PRN
Start: 2014-08-07 — End: 2014-08-08

## 2014-08-07 MED ORDER — BISACODYL 5 MG PO TBEC
5.0000 mg | DELAYED_RELEASE_TABLET | Freq: Every day | ORAL | Status: DC | PRN
  Filled 2014-08-07: qty 1

## 2014-08-07 MED ORDER — MUPIROCIN 2 % EX OINT
TOPICAL_OINTMENT | CUTANEOUS | Status: AC
  Administered 2014-08-07: 1 via TOPICAL
  Filled 2014-08-07: qty 22

## 2014-08-07 MED ORDER — ALUM & MAG HYDROXIDE-SIMETH 200-200-20 MG/5ML PO SUSP: 30.0000 mL | Freq: Four times a day (QID) | ORAL | Status: DC | PRN

## 2014-08-07 MED ORDER — ACETAMINOPHEN 325 MG PO TABS: 650.0000 mg | ORAL_TABLET | ORAL | Status: DC | PRN

## 2014-08-07 MED ORDER — PROMETHAZINE HCL 25 MG/ML IJ SOLN: 6.2500 mg | INTRAMUSCULAR | Status: DC | PRN

## 2014-08-07 MED ORDER — FENTANYL CITRATE 0.05 MG/ML IJ SOLN
INTRAMUSCULAR | Status: AC
  Filled 2014-08-07: qty 5

## 2014-08-07 MED ORDER — FENTANYL CITRATE 0.05 MG/ML IJ SOLN
INTRAMUSCULAR | Status: DC | PRN
  Administered 2014-08-07: 100 ug via INTRAVENOUS
  Administered 2014-08-07: 50 ug via INTRAVENOUS

## 2014-08-07 MED ORDER — MUPIROCIN 2 % EX OINT
1.0000 "application " | TOPICAL_OINTMENT | Freq: Once | CUTANEOUS | Status: AC
  Administered 2014-08-07: 1 via TOPICAL

## 2014-08-07 MED ORDER — BUPIVACAINE-EPINEPHRINE (PF) 0.25% -1:200000 IJ SOLN
INTRAMUSCULAR | Status: AC
  Filled 2014-08-07: qty 30

## 2014-08-07 MED ORDER — FLEET ENEMA 7-19 GM/118ML RE ENEM
1.0000 | ENEMA | Freq: Once | RECTAL | Status: AC | PRN
  Filled 2014-08-07: qty 1

## 2014-08-07 MED ORDER — ZOLPIDEM TARTRATE 5 MG PO TABS: 5.0000 mg | ORAL_TABLET | Freq: Every evening | ORAL | Status: DC | PRN

## 2014-08-07 MED ORDER — SODIUM CHLORIDE 0.9 % IJ SOLN: 3.0000 mL | Freq: Two times a day (BID) | INTRAMUSCULAR | Status: DC

## 2014-08-07 MED ORDER — DIAZEPAM 5 MG PO TABS
5.0000 mg | ORAL_TABLET | Freq: Four times a day (QID) | ORAL | Status: DC | PRN
  Administered 2014-08-08: 5 mg via ORAL
  Filled 2014-08-07: qty 1

## 2014-08-07 MED ORDER — BACITRACIN 500 UNIT/GM EX OINT
TOPICAL_OINTMENT | CUTANEOUS | Status: DC | PRN
  Administered 2014-08-07: 1 via TOPICAL

## 2014-08-07 MED ORDER — ROCURONIUM BROMIDE 100 MG/10ML IV SOLN
INTRAVENOUS | Status: DC | PRN
  Administered 2014-08-07: 25 mg via INTRAVENOUS

## 2014-08-07 MED ORDER — MENTHOL 3 MG MT LOZG: 1.0000 | LOZENGE | OROMUCOSAL | Status: DC | PRN

## 2014-08-07 MED ORDER — SENNOSIDES-DOCUSATE SODIUM 8.6-50 MG PO TABS
1.0000 | ORAL_TABLET | Freq: Every evening | ORAL | Status: DC | PRN
  Filled 2014-08-07: qty 1

## 2014-08-07 MED ORDER — OXYCODONE HCL 5 MG/5ML PO SOLN: 5.0000 mg | Freq: Once | ORAL | Status: DC | PRN

## 2014-08-07 MED ORDER — EPHEDRINE SULFATE 50 MG/ML IJ SOLN
INTRAMUSCULAR | Status: DC | PRN
  Administered 2014-08-07: 15 mg via INTRAVENOUS
  Administered 2014-08-07: 10 mg via INTRAVENOUS
  Administered 2014-08-07: 15 mg via INTRAVENOUS

## 2014-08-07 MED ORDER — PROPOFOL 10 MG/ML IV BOLUS
INTRAVENOUS | Status: DC | PRN
  Administered 2014-08-07 (×2): 120 mg via INTRAVENOUS

## 2014-08-07 MED ORDER — OXYCODONE HCL 5 MG PO TABS: 5.0000 mg | ORAL_TABLET | Freq: Once | ORAL | Status: DC | PRN

## 2014-08-07 MED ORDER — ONDANSETRON HCL 4 MG/2ML IJ SOLN
INTRAMUSCULAR | Status: AC
  Filled 2014-08-07: qty 6

## 2014-08-07 MED ORDER — POVIDONE-IODINE 7.5 % EX SOLN
Freq: Once | CUTANEOUS | Status: DC
  Filled 2014-08-07: qty 118

## 2014-08-07 MED ORDER — MORPHINE SULFATE 2 MG/ML IJ SOLN: 1.0000 mg | INTRAMUSCULAR | Status: DC | PRN

## 2014-08-07 MED ORDER — OXYCODONE-ACETAMINOPHEN 5-325 MG PO TABS
1.0000 | ORAL_TABLET | ORAL | Status: DC | PRN
  Administered 2014-08-08: 1 via ORAL
  Filled 2014-08-07: qty 1

## 2014-08-07 MED ORDER — ACETAMINOPHEN 650 MG RE SUPP: 650.0000 mg | RECTAL | Status: DC | PRN

## 2014-08-07 MED ORDER — SODIUM CHLORIDE 0.9 % IJ SOLN: 3.0000 mL | INTRAMUSCULAR | Status: DC | PRN

## 2014-08-07 MED ORDER — ONDANSETRON HCL 4 MG/2ML IJ SOLN
INTRAMUSCULAR | Status: DC | PRN
  Administered 2014-08-07: 4 mg via INTRAVENOUS

## 2014-08-07 MED ORDER — LIDOCAINE HCL (CARDIAC) 20 MG/ML IV SOLN
INTRAVENOUS | Status: DC | PRN
  Administered 2014-08-07 (×2): 50 mg via INTRAVENOUS

## 2014-08-07 MED ORDER — BACITRACIN ZINC 500 UNIT/GM EX OINT
TOPICAL_OINTMENT | CUTANEOUS | Status: AC
Start: 2014-08-07 — End: 2014-08-07
  Filled 2014-08-07: qty 28.35

## 2014-08-07 MED ORDER — SODIUM CHLORIDE 0.9 % IV SOLN: 250.0000 mL | INTRAVENOUS | Status: DC

## 2014-08-07 MED ORDER — LACTATED RINGERS IV SOLN
INTRAVENOUS | Status: DC
  Administered 2014-08-07: 11:00:00 via INTRAVENOUS

## 2014-08-07 SURGICAL SUPPLY — 43 items
BANDAGE ADH SHEER 1  50/CT (GAUZE/BANDAGES/DRESSINGS) ×6 IMPLANT
BLADE SURG 15 STRL LF DISP TIS (BLADE) ×1 IMPLANT
BLADE SURG 15 STRL SS (BLADE) ×2
CEMENT BONE KYPHX HV R (Orthopedic Implant) ×3 IMPLANT
CEMENT KYPHON C01A KIT/MIXER (Cement) ×6 IMPLANT
COVER MAYO STAND STRL (DRAPES) ×3 IMPLANT
CURETTE WEDGE 8.5MM KYPHX (MISCELLANEOUS) ×3 IMPLANT
DERMABOND ADVANCED (GAUZE/BANDAGES/DRESSINGS)
DERMABOND ADVANCED .7 DNX12 (GAUZE/BANDAGES/DRESSINGS) IMPLANT
DRAPE C-ARM 42X72 X-RAY (DRAPES) ×3 IMPLANT
DRAPE INCISE IOBAN 66X45 STRL (DRAPES) ×3 IMPLANT
DRAPE LAPAROTOMY T 102X78X121 (DRAPES) ×3 IMPLANT
DRAPE SURG 17X23 STRL (DRAPES) ×12 IMPLANT
DURAPREP 26ML APPLICATOR (WOUND CARE) ×3 IMPLANT
GAUZE SPONGE 4X4 16PLY XRAY LF (GAUZE/BANDAGES/DRESSINGS) ×3 IMPLANT
GLOVE BIO SURGEON STRL SZ7 (GLOVE) ×3 IMPLANT
GLOVE BIO SURGEON STRL SZ8 (GLOVE) ×3 IMPLANT
GLOVE BIOGEL PI IND STRL 7.0 (GLOVE) ×1 IMPLANT
GLOVE BIOGEL PI IND STRL 8 (GLOVE) ×1 IMPLANT
GLOVE BIOGEL PI INDICATOR 7.0 (GLOVE) ×2
GLOVE BIOGEL PI INDICATOR 8 (GLOVE) ×2
GOWN STRL REUS W/ TWL LRG LVL3 (GOWN DISPOSABLE) ×2 IMPLANT
GOWN STRL REUS W/ TWL XL LVL3 (GOWN DISPOSABLE) ×1 IMPLANT
GOWN STRL REUS W/TWL LRG LVL3 (GOWN DISPOSABLE) ×4
GOWN STRL REUS W/TWL XL LVL3 (GOWN DISPOSABLE) ×2
KIT BASIN OR (CUSTOM PROCEDURE TRAY) ×3 IMPLANT
KIT ROOM TURNOVER OR (KITS) ×3 IMPLANT
NEEDLE 22X1 1/2 (OR ONLY) (NEEDLE) IMPLANT
NEEDLE HYPO 25X1 1.5 SAFETY (NEEDLE) IMPLANT
NEEDLE SPNL 18GX3.5 QUINCKE PK (NEEDLE) ×6 IMPLANT
NS IRRIG 1000ML POUR BTL (IV SOLUTION) ×3 IMPLANT
PACK SURGICAL SETUP 50X90 (CUSTOM PROCEDURE TRAY) ×3 IMPLANT
PAD ARMBOARD 7.5X6 YLW CONV (MISCELLANEOUS) ×6 IMPLANT
POSITIONER HEAD PRONE TRACH (MISCELLANEOUS) ×3 IMPLANT
SPONGE GAUZE 4X4 12PLY STER LF (GAUZE/BANDAGES/DRESSINGS) ×6 IMPLANT
SUT MNCRL AB 4-0 PS2 18 (SUTURE) ×3 IMPLANT
SYR BULB IRRIGATION 50ML (SYRINGE) ×3 IMPLANT
SYR CONTROL 10ML LL (SYRINGE) ×3 IMPLANT
TAPE CLOTH SURG 4X10 WHT LF (GAUZE/BANDAGES/DRESSINGS) ×6 IMPLANT
TOWEL OR 17X24 6PK STRL BLUE (TOWEL DISPOSABLE) ×3 IMPLANT
TOWEL OR 17X26 10 PK STRL BLUE (TOWEL DISPOSABLE) ×3 IMPLANT
TRAY KYPHOPAK 15/3 ONESTEP 1ST (MISCELLANEOUS) ×3 IMPLANT
WATER STERILE IRR 1000ML POUR (IV SOLUTION) ×3 IMPLANT

## 2014-08-07 NOTE — Transfer of Care (Signed)
Immediate Anesthesia Transfer of Care Note  Patient: Darren [name]  Procedure(s) Performed: Procedure(s) with comments:  Lumbar Three Kyphoplasty (N/A) - Lumbar 3 kyphoplasty  Patient Location: PACU  Anesthesia Type:General  Level of Consciousness: awake  Airway & Oxygen Therapy: Patient Spontanous Breathing and Patient connected to nasal cannula oxygen  Post-op Assessment: Report given to RN, Post -op Vital signs reviewed and stable and Patient moving all extremities  Post vital signs: Reviewed and stable  Last Vitals:  Filed Vitals:   08/07/14 1035  BP: 119/70  Pulse: 89  Temp: 36.4 C  Resp: 20    Complications: No apparent anesthesia complications

## 2014-08-07 NOTE — H&P (Signed)
     PREOPERATIVE H&P  Chief Complaint: low back pain  HPI: Darren HarrisonRichard M [name] is a 74 y.o. male who presents with ongoing pain in the low back  MRI/xrays reveal an L3 compression fracture  Patient has failed multiple forms of conservative care and continues to have pain (see office notes for additional details regarding the patient's full course of treatment)  Past Medical History  Diagnosis Date  . Hypertension   . Hyperlipidemia   . GERD (gastroesophageal reflux disease)   . Carotid bruit   . Stroke     uses walker  . Shortness of breath dyspnea     with exertion   . Arthritis    Past Surgical History  Procedure Laterality Date  . Hernia repair    . Tonsillectomy    . Mouth surgery     History   Social History  . Marital Status: Single    Spouse Name: N/A  . Number of Children: N/A  . Years of Education: N/A   Social History Main Topics  . Smoking status: Current Some Day Smoker -- 0.10 packs/day  . Smokeless tobacco: Not on file  . Alcohol Use: No  . Drug Use: No  . Sexual Activity: Not on file   Other Topics Concern  . None   Social History Narrative   Family History  Problem Relation Age of Onset  . Alzheimer's disease Mother   . Dementia Mother   . Emphysema Father   . Hypertension Sister   . Hypertension Brother   . Cancer Sister    No Known Allergies Prior to Admission medications   Medication Sig Start Date End Date Taking? Authorizing Provider  aspirin EC 325 MG tablet Take 325 mg by mouth daily.    Historical Provider, MD  lisinopril (PRINIVIL,ZESTRIL) 2.5 MG tablet Take 2.5 mg by mouth daily.    Historical Provider, MD  loratadine (CLARITIN) 10 MG tablet Take 10 mg by mouth daily.    Historical Provider, MD  simvastatin (ZOCOR) 40 MG tablet Take 40 mg by mouth daily.    Historical Provider, MD     All other systems have been reviewed and were otherwise negative with the exception of those mentioned in the HPI and as  above.  Physical Exam: There were no vitals filed for this visit.  General: Alert, no acute distress Cardiovascular: No pedal edema Respiratory: No cyanosis, no use of accessory musculature Skin: No lesions in the area of chief complaint Neurologic: Sensation intact distally Psychiatric: Patient is competent for consent with normal mood and affect Lymphatic: No axillary or cervical lymphadenopathy  MUSCULOSKELETAL: + TTP low back  Assessment/Plan: Lumbar 3 compression fracture Plan for Procedure(s): KYPHOPLASTY   Darren [name],Darren Cotham LEONARD, MD 08/07/2014 8:21 AM

## 2014-08-07 NOTE — OR Nursing (Signed)
Dr. Basilio CairoGermertoth in to evaluate patient to determine need for telemetry. Will continue plan to transfer to 3500

## 2014-08-07 NOTE — Anesthesia Preprocedure Evaluation (Addendum)
Anesthesia Evaluation  Patient identified by MRN, date of birth, ID band Patient awake    Reviewed: Allergy & Precautions, NPO status , Patient's Chart, lab work & pertinent test results  Airway Mallampati: III  TM Distance: >3 FB Neck ROM: Full    Dental  (+) Upper Dentures, Lower Dentures   Pulmonary shortness of breath and with exertion, Current Smoker,  breath sounds clear to auscultation        Cardiovascular hypertension, Rhythm:Regular Rate:Normal + Systolic murmurs [id number] TTE: NL EF. Moderate MR.   Neuro/Psych CVA    GI/Hepatic Neg liver ROS, GERD-  ,  Endo/Other  negative endocrine ROS  Renal/GU Renal InsufficiencyRenal disease     Musculoskeletal  (+) Arthritis -,   Abdominal   Peds  Hematology negative hematology ROS (+)   Anesthesia Other Findings   Reproductive/Obstetrics                            Anesthesia Physical Anesthesia Plan  ASA: III  Anesthesia Plan: General   Post-op Pain Management:    Induction: Intravenous  Airway Management Planned: Oral ETT  Additional Equipment:   Intra-op Plan:   Post-operative Plan: Extubation in OR  Informed Consent: I have reviewed the patients History and Physical, chart, labs and discussed the procedure including the risks, benefits and alternatives for the proposed anesthesia with the patient or authorized representative who has indicated his/her understanding and acceptance.     Plan Discussed with: CRNA  Anesthesia Plan Comments:         Anesthesia Quick Evaluation

## 2014-08-07 NOTE — Addendum Note (Signed)
Addendum  created 08/07/14 1734 by Gaylan GeroldJohn R Yvette Loveless, MD   Modules edited: Notes Section   Notes Section:  File: [id number]

## 2014-08-07 NOTE — Anesthesia Postprocedure Evaluation (Addendum)
Anesthesia Post Note  Patient: Darren HarrisonRichard M Ruble  Procedure(s) Performed: Procedure(s) (LRB):  Lumbar Three Kyphoplasty (N/A)  Anesthesia type: general  Patient location: PACU  Post pain: Pain level controlled  Post assessment: Patient's Cardiovascular Status Stable  Last Vitals:  Filed Vitals:   08/07/14 1645  BP:   Pulse: 41  Temp:   Resp: 17    Post vital signs: Reviewed and stable  Level of consciousness: sedated  Complications: No apparent anesthesia complications   Pt evaluated prior to D/C. Ventricular bigeminy noted on monitor. Present on preoperative EKG. No mention made in notes. Pt states he has irregular HR and has had this for many years. VSS otherwise. D/C to floor.

## 2014-08-07 NOTE — Care Management (Signed)
Utilization Review completed   Mehkai Gallo,RN, BSN,CCM 

## 2014-08-08 NOTE — Discharge Instructions (Signed)
WOUND CARE   Pt can use barrier cream to buttocks wounds PRN after discharge home.

## 2014-08-08 NOTE — Op Note (Signed)
[name]:  Pasko, Delman           ACCOUNT NO.:  [id number]  MEDICAL RECORD NO.:  [id number]  LOCATION:  3C08C                        FACILITY:  [facility]  PHYSICIAN:  Estill BambergMark Herbie Lehrmann, MD      DATE OF BIRTH:  [birth year]  DATE OF PROCEDURE:  08/07/2014                              OPERATIVE REPORT   PREOPERATIVE DIAGNOSIS:  L3 compression fracture.  POSTOPERATIVE DIAGNOSIS:  L3 compression fracture.  PROCEDURE:  L3 kyphoplasty.  SURGEON:  Estill BambergMark Latham Kinzler, MD  ASSISTANT:  Jason CoopKayla McKenzie, PA-C.  ANESTHESIA:  General endotracheal anesthesia.  COMPLICATIONS:  None.  DISPOSITION:  Stable.  ESTIMATED BLOOD LOSS:  Minimal.  INDICATIONS FOR SURGERY:  Briefly, Mr. [name] is a 74 year old male, who I did initially evaluate on July 05, 2014, with a 1-week history of pain in his low back.  This was ultimately diagnosed as being secondary to an L3 compression fracture.  I did see the patient for followup evaluation on July 23, 2014.  He did continue to have ongoing 5/10 pain.  The pain was increased when he stood.  I did feel that the pain was secondary to the compression fracture noted on radiographs and on an MRI.  I did discuss with the patient and his family the risks and benefits regarding proceeding with the procedure noted above.  The patient did elect to proceed.  OPERATIVE DETAILS:  On August 07, 2014, the patient was brought to surgery and general endotracheal anesthesia was administered.  The patient was placed prone on a well-padded flat Jackson bed.  Replacement of the patient's chest and hips.  Antibiotics were given and a time-out procedure was performed.  I then made 2 stab incisions lateral to the L3 pedicles.  Jamshidi needles were advanced across the L3 pedicles bilaterally.  I did liberally use AP and lateral fluoroscopy.  I then used a drill and a curette into the L3 vertebral body.  I then introduced kyphoplasty balloons bilaterally.  I was able to inflate  the kyphoplasty balloons substantially.  I was able to partially restore the height of the superior endplate at L3.  I then injected a total of approximately 7 mL of cement into the L3 vertebral body half to the right and half to the left cannula.  I did note excellent interdigitation of the cement in the L3 vertebral body.  There was no abnormal extravasation of cement either into the spinal canal or anteriorly or laterally.  I was extremely pleased with the radiographs. Once the cement was allowed to harden, the balloons were removed.  The wound was then copiously irrigated.  I then closed the incisions using 3- 0 Monocryl. Bacitracin and a sterile dressing was applied.  The patient was then awoken from general endotracheal anesthesia and transferred to recovery in stable condition.  All instrument counts were correct at the termination of the procedure.     Estill BambergMark Lizzete Gough, MD     MD/MEDQ  D:  08/07/2014  T:  08/08/2014  Job:  [id number]

## 2014-08-08 NOTE — Progress Notes (Signed)
    Patient doing well Low back pain improved   Physical Exam: Filed Vitals:   08/08/14 0415  BP: 106/74  Pulse: 77  Temp: 98.3 F (36.8 C)  Resp: 16    Dressing in place NVI  POD #1 s/p L3 kyphoplasty, doing great  - encourage ambulation - likely d/c home today, f/u 2 weeks

## 2014-08-08 NOTE — Consult Note (Addendum)
WOC wound consult note Reason for Consult: Consult requested for buttocks wounds. They were noted when pt went to the OR, according to the EMR.  Pt states he has psoriasis to several areas and thinks these wounds might be related to this etiology.  He states site has been present "for awhile" prior to admission.  He admits to sitting in a recliner chair at home for extended periods of time and that he is occasionally incontinent. Wound type: Patchy areas of partial thickness skin loss located inside gluteal fold; affected area approx 2X2cm with depth of .1cm. Wound beds are yellow and moist, small amt yellow drainage, no odor. Pressure Ulcer POA: This is NOT a pressure ulcer; it is not located over a bony prominence; appearance consistent with moisture-associated skin damage. Periwound: Intact skin surrounding Dressing procedure/placement/frequency: Foam dressing to protect and repel moisture.  This can remain in place for 5 days and can be removed daily for a bath or shower, then replaced. Pt can use barrier cream to protect wound at home and this will help repel moisture; explained topical treatments and pt verbalizes understanding.  Educated regarding offloading to affected area and avoiding prolonged moisture or times sitting up in chair.  Encouraged pt to make a dermatology appointment for his psoriasis after discharge.   Please re-consult if further assistance is needed.  Thank-you,  Cammie Mcgeeawn Tove Wideman MSN, RN, CWOCN, [place]WCN-AP, CNS [phone]

## 2014-08-09 ENCOUNTER — Encounter (HOSPITAL_COMMUNITY): Payer: Self-pay | Admitting: Orthopedic Surgery

## 2014-08-19 ENCOUNTER — Encounter (HOSPITAL_COMMUNITY): Payer: Self-pay | Admitting: Emergency Medicine

## 2014-08-19 ENCOUNTER — Inpatient Hospital Stay (HOSPITAL_COMMUNITY)
Admission: EM | Admit: 2014-08-19 | Discharge: 2014-09-27 | DRG: 216 | Disposition: E | Payer: Medicare Other | Attending: Thoracic Surgery (Cardiothoracic Vascular Surgery) | Admitting: Thoracic Surgery (Cardiothoracic Vascular Surgery)

## 2014-08-19 ENCOUNTER — Emergency Department (HOSPITAL_COMMUNITY): Payer: Medicare Other

## 2014-08-19 DIAGNOSIS — Z01818 Encounter for other preprocedural examination: Secondary | ICD-10-CM

## 2014-08-19 DIAGNOSIS — E876 Hypokalemia: Secondary | ICD-10-CM | POA: Diagnosis not present

## 2014-08-19 DIAGNOSIS — M7989 Other specified soft tissue disorders: Secondary | ICD-10-CM

## 2014-08-19 DIAGNOSIS — J449 Chronic obstructive pulmonary disease, unspecified: Secondary | ICD-10-CM | POA: Diagnosis present

## 2014-08-19 DIAGNOSIS — I420 Dilated cardiomyopathy: Secondary | ICD-10-CM | POA: Diagnosis present

## 2014-08-19 DIAGNOSIS — E785 Hyperlipidemia, unspecified: Secondary | ICD-10-CM | POA: Diagnosis present

## 2014-08-19 DIAGNOSIS — I493 Ventricular premature depolarization: Secondary | ICD-10-CM | POA: Diagnosis present

## 2014-08-19 DIAGNOSIS — R7989 Other specified abnormal findings of blood chemistry: Secondary | ICD-10-CM

## 2014-08-19 DIAGNOSIS — D7582 Heparin induced thrombocytopenia (HIT): Secondary | ICD-10-CM | POA: Diagnosis not present

## 2014-08-19 DIAGNOSIS — F17210 Nicotine dependence, cigarettes, uncomplicated: Secondary | ICD-10-CM | POA: Diagnosis present

## 2014-08-19 DIAGNOSIS — I272 Other secondary pulmonary hypertension: Secondary | ICD-10-CM | POA: Diagnosis present

## 2014-08-19 DIAGNOSIS — D62 Acute posthemorrhagic anemia: Secondary | ICD-10-CM | POA: Diagnosis not present

## 2014-08-19 DIAGNOSIS — Z9889 Other specified postprocedural states: Secondary | ICD-10-CM

## 2014-08-19 DIAGNOSIS — E46 Unspecified protein-calorie malnutrition: Secondary | ICD-10-CM | POA: Diagnosis present

## 2014-08-19 DIAGNOSIS — I129 Hypertensive chronic kidney disease with stage 1 through stage 4 chronic kidney disease, or unspecified chronic kidney disease: Secondary | ICD-10-CM | POA: Diagnosis present

## 2014-08-19 DIAGNOSIS — N183 Chronic kidney disease, stage 3 unspecified: Secondary | ICD-10-CM

## 2014-08-19 DIAGNOSIS — Z6822 Body mass index (BMI) 22.0-22.9, adult: Secondary | ICD-10-CM

## 2014-08-19 DIAGNOSIS — I700 Atherosclerosis of aorta: Secondary | ICD-10-CM | POA: Diagnosis present

## 2014-08-19 DIAGNOSIS — I214 Non-ST elevation (NSTEMI) myocardial infarction: Secondary | ICD-10-CM | POA: Diagnosis not present

## 2014-08-19 DIAGNOSIS — I341 Nonrheumatic mitral (valve) prolapse: Secondary | ICD-10-CM | POA: Diagnosis present

## 2014-08-19 DIAGNOSIS — Z8249 Family history of ischemic heart disease and other diseases of the circulatory system: Secondary | ICD-10-CM

## 2014-08-19 DIAGNOSIS — M79671 Pain in right foot: Secondary | ICD-10-CM

## 2014-08-19 DIAGNOSIS — D649 Anemia, unspecified: Secondary | ICD-10-CM

## 2014-08-19 DIAGNOSIS — R778 Other specified abnormalities of plasma proteins: Secondary | ICD-10-CM | POA: Diagnosis present

## 2014-08-19 DIAGNOSIS — Y713 Surgical instruments, materials and cardiovascular devices (including sutures) associated with adverse incidents: Secondary | ICD-10-CM

## 2014-08-19 DIAGNOSIS — R0602 Shortness of breath: Secondary | ICD-10-CM | POA: Diagnosis not present

## 2014-08-19 DIAGNOSIS — J90 Pleural effusion, not elsewhere classified: Secondary | ICD-10-CM

## 2014-08-19 DIAGNOSIS — M1990 Unspecified osteoarthritis, unspecified site: Secondary | ICD-10-CM | POA: Diagnosis present

## 2014-08-19 DIAGNOSIS — N289 Disorder of kidney and ureter, unspecified: Secondary | ICD-10-CM

## 2014-08-19 DIAGNOSIS — I5041 Acute combined systolic (congestive) and diastolic (congestive) heart failure: Secondary | ICD-10-CM

## 2014-08-19 DIAGNOSIS — I340 Nonrheumatic mitral (valve) insufficiency: Secondary | ICD-10-CM

## 2014-08-19 DIAGNOSIS — R092 Respiratory arrest: Secondary | ICD-10-CM | POA: Diagnosis not present

## 2014-08-19 DIAGNOSIS — I509 Heart failure, unspecified: Secondary | ICD-10-CM

## 2014-08-19 DIAGNOSIS — Z951 Presence of aortocoronary bypass graft: Secondary | ICD-10-CM

## 2014-08-19 DIAGNOSIS — I2510 Atherosclerotic heart disease of native coronary artery without angina pectoris: Secondary | ICD-10-CM

## 2014-08-19 DIAGNOSIS — I959 Hypotension, unspecified: Secondary | ICD-10-CM | POA: Diagnosis not present

## 2014-08-19 DIAGNOSIS — N179 Acute kidney failure, unspecified: Secondary | ICD-10-CM | POA: Diagnosis not present

## 2014-08-19 DIAGNOSIS — J9811 Atelectasis: Secondary | ICD-10-CM | POA: Diagnosis not present

## 2014-08-19 DIAGNOSIS — Z66 Do not resuscitate: Secondary | ICD-10-CM | POA: Diagnosis present

## 2014-08-19 DIAGNOSIS — I69354 Hemiplegia and hemiparesis following cerebral infarction affecting left non-dominant side: Secondary | ICD-10-CM

## 2014-08-19 HISTORY — DX: Other specified postprocedural states: Z98.890

## 2014-08-19 HISTORY — DX: Heart failure, unspecified: I50.9

## 2014-08-19 HISTORY — DX: Presence of aortocoronary bypass graft: Z95.1

## 2014-08-19 LAB — COMPREHENSIVE METABOLIC PANEL
ALT: 39 U/L (ref 0–53)
ANION GAP: 9 (ref 5–15)
AST: 32 U/L (ref 0–37)
Albumin: 3.3 g/dL — ABNORMAL LOW (ref 3.5–5.2)
Alkaline Phosphatase: 86 U/L (ref 39–117)
BUN: 49 mg/dL — AB (ref 6–23)
CO2: 20 mmol/L (ref 19–32)
Calcium: 9 mg/dL (ref 8.4–10.5)
Chloride: 110 mmol/L (ref 96–112)
Creatinine, Ser: 1.86 mg/dL — ABNORMAL HIGH (ref 0.50–1.35)
GFR, EST AFRICAN AMERICAN: 40 mL/min — AB (ref >90)
GFR, EST NON AFRICAN AMERICAN: 34 mL/min — AB (ref >90)
Glucose, Bld: 99 mg/dL (ref 70–99)
Potassium: 4.7 mmol/L (ref 3.5–5.1)
SODIUM: 139 mmol/L (ref 135–145)
Total Bilirubin: 0.9 mg/dL (ref 0.3–1.2)
Total Protein: 6.2 g/dL (ref 6.0–8.3)

## 2014-08-19 LAB — CBC WITH DIFFERENTIAL/PLATELET
BASOS PCT: 0 % (ref 0–1)
Basophils Absolute: 0 10*3/uL (ref 0.0–0.1)
Eosinophils Absolute: 0 10*3/uL (ref 0.0–0.7)
Eosinophils Relative: 0 % (ref 0–5)
HCT: 41.1 % (ref 39.0–52.0)
Hemoglobin: 14 g/dL (ref 13.0–17.0)
LYMPHS ABS: 1.6 10*3/uL (ref 0.7–4.0)
Lymphocytes Relative: 20 % (ref 12–46)
MCH: 30.2 pg (ref 26.0–34.0)
MCHC: 34.1 g/dL (ref 30.0–36.0)
MCV: 88.6 fL (ref 78.0–100.0)
Monocytes Absolute: 0.5 10*3/uL (ref 0.1–1.0)
Monocytes Relative: 6 % (ref 3–12)
NEUTROS PCT: 74 % (ref 43–77)
Neutro Abs: 6 10*3/uL (ref 1.7–7.7)
Platelets: 320 10*3/uL (ref 150–400)
RBC: 4.64 MIL/uL (ref 4.22–5.81)
RDW: 15.2 % (ref 11.5–15.5)
WBC: 8.2 10*3/uL (ref 4.0–10.5)

## 2014-08-19 LAB — TROPONIN I: TROPONIN I: 0.53 ng/mL — AB (ref <0.031)

## 2014-08-19 LAB — BRAIN NATRIURETIC PEPTIDE: B Natriuretic Peptide: 1548.8 pg/mL — ABNORMAL HIGH (ref 0.0–100.0)

## 2014-08-19 MED ORDER — HEPARIN BOLUS VIA INFUSION
3500.0000 [IU] | Freq: Once | INTRAVENOUS | Status: AC
  Administered 2014-08-19: 3500 [IU] via INTRAVENOUS
  Filled 2014-08-19: qty 3500

## 2014-08-19 MED ORDER — FUROSEMIDE 10 MG/ML IJ SOLN
40.0000 mg | Freq: Once | INTRAMUSCULAR | Status: AC
Start: 2014-08-19 — End: 2014-08-19
  Administered 2014-08-19: 40 mg via INTRAVENOUS
  Filled 2014-08-19: qty 4

## 2014-08-19 MED ORDER — HEPARIN (PORCINE) IN NACL 100-0.45 UNIT/ML-% IJ SOLN
900.0000 [IU]/h | INTRAMUSCULAR | Status: DC
  Administered 2014-08-19: 700 [IU]/h via INTRAVENOUS
  Administered 2014-08-20: 1050 [IU]/h via INTRAVENOUS
  Administered 2014-08-21 – 2014-08-24 (×4): 900 [IU]/h via INTRAVENOUS
  Filled 2014-08-19 (×8): qty 250

## 2014-08-19 MED ORDER — ASPIRIN 81 MG PO CHEW: 324.0000 mg | CHEWABLE_TABLET | Freq: Once | ORAL | Status: DC

## 2014-08-19 NOTE — ED Provider Notes (Signed)
CSN: [id number]     Arrival date & time 08/13/2014  1333 History   First MD Initiated Contact with Patient 08/12/2014 1956     Chief Complaint  Patient presents with  . Leg Swelling     (Consider location/radiation/quality/duration/timing/severity/associated sxs/prior Treatment) The history is provided by the patient and a relative.   74 year old male is 12 days post kyphoplasty of 3 vertebrae and comes in because of leg swelling. Family noted his leg started swelling about 2 days after the procedure and it is been getting progressively worse. Family has also noted that his hands appear swollen. He has noted some dyspnea which is worse with exertion but he has not noticed any orthopnea. He denies chest pain. He denies any pain in his legs. He is ambulating at home but not as much is family would like him to. His only home medications are ibuprofen and low-dose aspirin and he is not taking the low-dose aspirin since surgery. He had been prescribed narcotics and diazepam which she only took on one occasion and discontinued because of oversedation.  Past Medical History  Diagnosis Date  . Hypertension   . Hyperlipidemia   . GERD (gastroesophageal reflux disease)   . Carotid bruit   . Stroke     uses walker  . Shortness of breath dyspnea     with exertion   . Arthritis    Past Surgical History  Procedure Laterality Date  . Hernia repair    . Tonsillectomy    . Mouth surgery    . Kyphoplasty N/A 08/07/2014    Procedure:  Lumbar Three Kyphoplasty;  Surgeon: Emilee HeroMark Leonard Dumonski, MD;  [facility];  Service: Orthopedics;  Laterality: N/A;  Lumbar 3 kyphoplasty   Family History  Problem Relation Age of Onset  . Alzheimer's disease Mother   . Dementia Mother   . Emphysema Father   . Hypertension Sister   . Hypertension Brother   . Cancer Sister    History  Substance Use Topics  . Smoking status: Current Some Day Smoker -- 0.10 packs/day  . Smokeless tobacco: Not on file  . Alcohol  Use: No    Review of Systems  All other systems reviewed and are negative.     Allergies  Review of patient's allergies indicates no known allergies.  Home Medications   Prior to Admission medications   Not on File   BP 112/77 mmHg  Pulse 72  Temp(Src) 97.8 F (36.6 C) (Oral)  Resp 12  SpO2 99% Physical Exam  Nursing note and vitals reviewed.  74 year old male, resting comfortably and in no acute distress. Vital signs are normal. Oxygen saturation is 99%, which is normal. Head is normocephalic and atraumatic. PERRLA, EOMI. Oropharynx is clear. Neck is nontender and supple without adenopathy or JVD. Back is nontender and there is no CVA tenderness. Lungs are clear without rales, wheezes, or rhonchi. Chest is nontender. Heart has regular rate and rhythm with 3/6 holosystolic murmur. Abdomen is soft, flat, nontender without masses or hepatosplenomegaly and peristalsis is normoactive. Extremities have 2 pitting edema, there is 1 presacral edema. There is no anasarca. Skin is warm and dry without rash. Neurologic: Mental status is normal, cranial nerves are intact, there are no motor or sensory deficits.  ED Course  Procedures (including critical care time) Labs Review Results for orders placed or performed during the hospital encounter of 08/22/2014  Comprehensive metabolic panel  Result Value Ref Range   Sodium 139 135 - 145  mmol/L   Potassium 4.7 3.5 - 5.1 mmol/L   Chloride 110 96 - 112 mmol/L   CO2 20 19 - 32 mmol/L   Glucose, Bld 99 70 - 99 mg/dL   BUN 49 (H) 6 - 23 mg/dL   Creatinine, Ser 3.66 (H) 0.50 - 1.35 mg/dL   Calcium 9.0 8.4 - 44.0 mg/dL   Total Protein 6.2 6.0 - 8.3 g/dL   Albumin 3.3 (L) 3.5 - 5.2 g/dL   AST 32 0 - 37 U/L   ALT 39 0 - 53 U/L   Alkaline Phosphatase 86 39 - 117 U/L   Total Bilirubin 0.9 0.3 - 1.2 mg/dL   GFR calc non Af Amer 34 (L) >90 mL/min   GFR calc Af Amer 40 (L) >90 mL/min   Anion gap 9 5 - 15  CBC with Differential  Result  Value Ref Range   WBC 8.2 4.0 - 10.5 K/uL   RBC 4.64 4.22 - 5.81 MIL/uL   Hemoglobin 14.0 13.0 - 17.0 g/dL   HCT 34.7 42.5 - 95.6 %   MCV 88.6 78.0 - 100.0 fL   MCH 30.2 26.0 - 34.0 pg   MCHC 34.1 30.0 - 36.0 g/dL   RDW 38.7 56.4 - 33.2 %   Platelets 320 150 - 400 K/uL   Neutrophils Relative % 74 43 - 77 %   Neutro Abs 6.0 1.7 - 7.7 K/uL   Lymphocytes Relative 20 12 - 46 %   Lymphs Abs 1.6 0.7 - 4.0 K/uL   Monocytes Relative 6 3 - 12 %   Monocytes Absolute 0.5 0.1 - 1.0 K/uL   Eosinophils Relative 0 0 - 5 %   Eosinophils Absolute 0.0 0.0 - 0.7 K/uL   Basophils Relative 0 0 - 1 %   Basophils Absolute 0.0 0.0 - 0.1 K/uL  Troponin I  Result Value Ref Range   Troponin I 0.53 (HH) <0.031 ng/mL  Brain natriuretic peptide  Result Value Ref Range   B Natriuretic Peptide 1548.8 (H) 0.0 - 100.0 pg/mL   Imaging Review Dg Chest 2 View  08/05/2014   CLINICAL DATA:  Bilateral leg swelling. Back surgery 2 weeks ago. Persistent shortness of breath. Weakness.  EXAM: CHEST  2 VIEW  COMPARISON:  08/07/2014  FINDINGS: Moderate enlargement of the cardiopericardial silhouette. Atherosclerotic calcification of the aortic arch.  Airspace opacity in the lingula and left lower lobe with some associated adjacent pleural effusion. Suspected small right pleural effusion in the posterior costophrenic angle as well.  IMPRESSION: 1. Airspace opacity at the left lung base in excess of the passive atelectasis from the pleural effusion. Appearance could reflect atelectasis or pneumonia. 2. Small left and trace right pleural effusion. 3. Moderate enlargement of the cardiopericardial silhouette.   Electronically Signed   By: Gaylyn Rong M.D.   On: 08/16/2014 21:54     EKG Interpretation   Date/Time:  Monday August 19 2014 19:32:24 EST Ventricular Rate:  75 PR Interval:  138 QRS Duration: 93 QT Interval:  411 QTC Calculation: 459 R Axis:   91 Text Interpretation:  Sinus rhythm Multiform ventricular  premature  complexes Right axis deviation Repol abnrm suggests ischemia, diffuse  leads When compared with ECG of 08/07/2014, No significant change was found  Confirmed by [facility]  MD, Binnie Droessler ([id number]) on 08/06/2014 7:40:51 PM      MDM   Final diagnoses:  Shortness of breath  Acute congestive heart failure, unspecified congestive heart failure type  Elevated troponin I level  Renal insufficiency    Exertional dyspnea with findings of congestive heart failure with peripheral edema and presacral edema. However, lack of neck vein distention is unusual. X-ray confirms bilateral pleural effusions consistent with CHF and BNP is elevated consistent with CHF. There is slight worsening of prior renal insufficiency and troponin is mildly elevated as well. At this point, it is not clear whether he had primary non-STEMI with secondary CHF, or CHF with demand ischemia. He is started on heparin and aspirin. Case has been discussed with Dr. Zachery Conch of cardiology service who agrees to admit the patient.    Dione Booze, MD 09/16/14 [phone]

## 2014-08-19 NOTE — ED Notes (Addendum)
Pt c/o bilateral leg swelling; pt had back sx approx 2 weeks ago; generalized weakness noted

## 2014-08-19 NOTE — Progress Notes (Signed)
Bilateral lower extremity venous duplex completed:  No evidence of DVT, superficial thrombosis, or Baker's cyst.   

## 2014-08-19 NOTE — ED Notes (Signed)
Critical lab reported to Dr. Preston FleetingGlick.  Troponin 0.53.

## 2014-08-19 NOTE — H&P (Signed)
Patient ID: Darren [name] MRN: [id number], DOB/AGE: 10-26-1940   Admit date: 08/01/2014   Primary Physician: Pcp Not In System Primary Cardiologist: None  Pt. Profile:  74M smoker with HTN, HLD, GERD, CVA, CKD (baseline Cr 1.6), with L3 compression fracture s/p kyphoplasty on 2/10and history of MR who presents with new onset CHF and NSTEMI.  Problem List  Past Medical History  Diagnosis Date  . Hypertension   . Hyperlipidemia   . GERD (gastroesophageal reflux disease)   . Carotid bruit   . Stroke     uses walker  . Shortness of breath dyspnea     with exertion   . Arthritis     Past Surgical History  Procedure Laterality Date  . Hernia repair    . Tonsillectomy    . Mouth surgery    . Kyphoplasty N/A 08/07/2014    Procedure:  Lumbar Three Kyphoplasty;  Surgeon: Emilee Hero, MD;  Location: [facility] OR;  Service: Orthopedics;  Laterality: N/A;  Lumbar 3 kyphoplasty     Allergies  No Known Allergies  HPI  74M smoker with HTN, HLD, GERD, CVA, CKD (baseline Cr 1.6), with L3 compression fracture s/p kyphoplasty on 2/10 and history of MR who presents with new onset LE edema and worsened dyspnea.  Darren [name] reports that he developed LE swelling, hand swelling, and worsened dyspnea on exertion (beyond baseline which he attributes to smoking) over the past week.  Denies fevers, chills, sweats, chest pain, chest pressure, palpitations, or skipped beats. No change in weight. Because of these progressive symptoms, his daughters brought him to the ER.   In the ER, he was hemodynamically stable. There are documented spot heart rates suggesting bradycardia that are not substantiated by tele review. BP 101/65 on arrival. 100% on RA. Labs were notable for K 4.7, BUN 49, Cr 1.86 (up from 1.62), BNP 1548, TnI 0.53. No prior BNP in system. IV UFH was started and  PO aspirin and  IV lasix was given (with brisk UOP). CXR demonstrated small left and trace right sided  pleural effusion, moderate cardiomegaly, possible opacity at left base.   Of note, he was seen by Dr. Eldridge Dace in the past, but was not sure why. He was told he had a murmur and a "weak valve."  He states no testing was done and no treatment was recommended. Denies history of MI, cath, stress, TTE.  Of note, the pre-procedure anesthesia assessment states he had a TTE in 2008 that demonstrated moderate MR and a preserved EF.    Home Medications  Prior to Admission medications   Not on File    Family History  Family History  Problem Relation Age of Onset  . Alzheimer's disease Mother   . Dementia Mother   . Emphysema Father   . Hypertension Sister   . Hypertension Brother   . Cancer Sister     Social History  History   Social History  . Marital Status: Single    Spouse Name: N/A  . Number of Children: N/A  . Years of Education: N/A   Occupational History  . Not on file.   Social History Main Topics  . Smoking status: Current Some Day Smoker -- 0.10 packs/day  . Smokeless tobacco: Not on file  . Alcohol Use: No  . Drug Use: No  . Sexual Activity: Not on file   Other Topics Concern  . Not on file   Social History Narrative     Review  of Systems General:  No chills, fever, night sweats or weight changes.  Cardiovascular:  See HPI Dermatological: No rash, lesions/masses Respiratory: No cough Urologic: No hematuria, dysuria Abdominal:   No nausea, vomiting, diarrhea, bright red blood per rectum, melena, or hematemesis. + constipation Neurologic:  No visual changes, wkns, changes in mental status. All other systems reviewed and are otherwise negative except as noted above.  Physical Exam  Blood pressure 118/84, pulse 38, temperature 97.8 F (36.6 C), temperature source Oral, resp. rate 15, SpO2 100 %.  General: Pleasant, NAD Psych: Normal affect. Neuro: Alert and oriented X 3. Moves all extremities spontaneously. HEENT: Normal  Neck: Supple without bruits.  JVP elevated to low to mid neck at 45 degrees Lungs:  Resp regular and unlabored, CTA. Heart: RRR no s3, s4. 4/6 harsh holosystolic murmur at base with radiation to axilla and back with palpable thrill. Abdomen: Soft, non-tender, non-distended, BS + x 4.  Extremities: No clubbing, cyanosis. DP/PT/Radials 2+ and equal bilaterally. 3+ bilateral LE edema. Extremities cool.   Labs  Troponin (Point of Care Test) No results for input(s): TROPIPOC in the last 72 hours.  Recent Labs  08/20/2014 2052  TROPONINI 0.53*   Lab Results  Component Value Date   WBC 8.2 08/14/2014   HGB 14.0 08/05/2014   HCT 41.1 08/20/2014   MCV 88.6 08/24/2014   PLT 320 08/06/2014    Recent Labs Lab 08/15/2014 2052  NA 139  K 4.7  CL 110  CO2 20  BUN 49*  CREATININE 1.86*  CALCIUM 9.0  PROT 6.2  BILITOT 0.9  ALKPHOS 86  ALT 39  AST 32  GLUCOSE 99   No results found for: CHOL, HDL, LDLCALC, TRIG Lab Results  Component Value Date   DDIMER  06/17/2007    0.39        AT THE INHOUSE ESTABLISHED CUTOFF VALUE OF 0.48 ug/mL FEU, THIS ASSAY HAS BEEN DOCUMENTED IN THE LITERATURE TO HAVE     Radiology/Studies  Dg Chest 2 View  08/22/2014   CLINICAL DATA:  Bilateral leg swelling. Back surgery 2 weeks ago. Persistent shortness of breath. Weakness.  EXAM: CHEST  2 VIEW  COMPARISON:  08/07/2014  FINDINGS: Moderate enlargement of the cardiopericardial silhouette. Atherosclerotic calcification of the aortic arch.  Airspace opacity in the lingula and left lower lobe with some associated adjacent pleural effusion. Suspected small right pleural effusion in the posterior costophrenic angle as well.  IMPRESSION: 1. Airspace opacity at the left lung base in excess of the passive atelectasis from the pleural effusion. Appearance could reflect atelectasis or pneumonia. 2. Small left and trace right pleural effusion. 3. Moderate enlargement of the cardiopericardial silhouette.   Electronically Signed   By: Gaylyn Rong M.D.   On: 07/29/2014 21:54   Dg Chest 2 View  08/07/2014   CLINICAL DATA:  Preop for lumbar surgery.  EXAM: CHEST  2 VIEW  COMPARISON:  08/11/2007  FINDINGS: Small bilateral pleural effusions. Borderline cardiomegaly. There is moderate aortic tortuosity which is stable from prior. Stable calcification overlapping the heart, valvular versus coronary. Mild atelectasis in the lower lungs. No convincing pneumonia or edema.  IMPRESSION: Small bilateral pleural effusion and mild basilar atelectasis.   Electronically Signed   By: Marnee Spring M.D.   On: 08/07/2014 10:44   Dg Lumbar Spine 2-3 Views  08/07/2014   CLINICAL DATA:  L3 kyphoplasty  EXAM: DG C-ARM 61-120 MIN; LUMBAR SPINE - 2-3 VIEW  TECHNIQUE: AP and lateral fluoroscopic spot  views  FLUOROSCOPY TIME:  Radiation Exposure Index (as provided by the fluoroscopic device):  If the device does not provide the exposure index:  Fluoroscopy Time (in minutes and seconds):  2 minutes and 16 seconds  Number of Acquired Images:  2  COMPARISON:  07/17/2014  FINDINGS: Mild compression deformity at L3 status post kyphoplasty. Cement projects over the vertebral body.  IMPRESSION: Anticipated appearance status post kyphoplasty.   Electronically Signed   By: Esperanza Heiraymond  Rubner M.D.   On: 08/07/2014 16:00   Dg C-arm 1-60 Min  08/07/2014   CLINICAL DATA:  L3 kyphoplasty  EXAM: DG C-ARM 61-120 MIN; LUMBAR SPINE - 2-3 VIEW  TECHNIQUE: AP and lateral fluoroscopic spot views  FLUOROSCOPY TIME:  Radiation Exposure Index (as provided by the fluoroscopic device):  If the device does not provide the exposure index:  Fluoroscopy Time (in minutes and seconds):  2 minutes and 16 seconds  Number of Acquired Images:  2  COMPARISON:  07/17/2014  FINDINGS: Mild compression deformity at L3 status post kyphoplasty. Cement projects over the vertebral body.  IMPRESSION: Anticipated appearance status post kyphoplasty.   Electronically Signed   By: Esperanza Heiraymond  Rubner M.D.   On:  08/07/2014 16:00    ECG  Sep 04, 2014: NSR, low volts, cristae pattern, frequent PVCs of multiple morphologies with repol changes most likely due to LVH. Compared to prior on 08/07/14, PVCs are now of multiple morhpologies. On 2/10, they were all of similar morphology and in bigeminal pattern.    ASSESSMENT AND PLAN   69M smoker with HTN, HLD, GERD, CVA, CKD (baseline Cr 1.6), with L3 compression fracture s/p kyphoplasty on 2/10 and history of MR who presents with new onset CHF and NSTEMI.  CHF.  Based on exam and history, I am concerned that HF may be 2/2 severe MR. LE are cool raising the possibility of low cardiac output, although is mentating and responding well to IV lasix, suggesting at least a reasonable level of perfusion.  - TTE - lasix 40mg  IV BID - Based on lack of information on LV function, need for BB and ACE remains unclear.  - TSH  NSTEMI. Suspect the is a Type II MI 2/2 CHF based on story of  subacute development of CHF and absence of CP and ECG changes. However, will plan to treat as AMI as we get more information (TTE, trend TnI). He has risk factors for CAD. Will hold off on BB given cool extremities.  1. UFH 2. Cycle troponins 3. ASA 4. Atorva 80mg   Signed, Glori LuisFRIEDMAN, Rufina Kimery, MD 04/03/15, 11:25 PM

## 2014-08-19 NOTE — Progress Notes (Signed)
ANTICOAGULATION CONSULT NOTE - Initial Consult  Pharmacy Consult for Heparin Indication: chest pain/ACS  No Known Allergies  Patient Measurements:   Heparin Dosing Weight: 59 kg  Vital Signs: Temp: 97.8 F (36.6 C) (02/22 1935) Temp Source: Oral (02/22 1935) BP: 112/77 mmHg (02/22 1935) Pulse Rate: 72 (02/22 1935)  Labs:  Recent Labs  08/14/2014 2052  HGB 14.0  HCT 41.1  PLT 320  CREATININE 1.86*  TROPONINI 0.53*    Estimated Creatinine Clearance: 30 mL/min (by C-G formula based on Cr of 1.86).   Medical History: Past Medical History  Diagnosis Date  . Hypertension   . Hyperlipidemia   . GERD (gastroesophageal reflux disease)   . Carotid bruit   . Stroke     uses walker  . Shortness of breath dyspnea     with exertion   . Arthritis     Medications:   (Not in a hospital admission) Scheduled:  Infusions:   Assessment: 74yo male presents with leg swelling. Pharmacy is consulted to dose heparin for ACS/chest pain. Trop 0.53, BNP 1548.8, Hgb 14, Plt 320, sCr 1.86.  Goal of Therapy:  Heparin level 0.3-0.7 units/ml Monitor platelets by anticoagulation protocol: Yes   Plan:  Give 3500 units bolus x 1 Start heparin infusion at 700 units/hr Check anti-Xa level in 8 hours and daily while on heparin Continue to monitor H&H and platelets  Monitor s/sx of bleeding  Arlean Hoppingorey M. Newman PiesBall, PharmD Clinical Pharmacist Pager [phone] 08/16/2014,10:21 PM

## 2014-08-20 DIAGNOSIS — R092 Respiratory arrest: Secondary | ICD-10-CM | POA: Diagnosis not present

## 2014-08-20 DIAGNOSIS — I319 Disease of pericardium, unspecified: Secondary | ICD-10-CM

## 2014-08-20 DIAGNOSIS — N183 Chronic kidney disease, stage 3 (moderate): Secondary | ICD-10-CM | POA: Diagnosis present

## 2014-08-20 DIAGNOSIS — I5031 Acute diastolic (congestive) heart failure: Secondary | ICD-10-CM

## 2014-08-20 DIAGNOSIS — I2510 Atherosclerotic heart disease of native coronary artery without angina pectoris: Secondary | ICD-10-CM | POA: Diagnosis not present

## 2014-08-20 DIAGNOSIS — J449 Chronic obstructive pulmonary disease, unspecified: Secondary | ICD-10-CM | POA: Diagnosis present

## 2014-08-20 DIAGNOSIS — I509 Heart failure, unspecified: Secondary | ICD-10-CM | POA: Diagnosis not present

## 2014-08-20 DIAGNOSIS — M1990 Unspecified osteoarthritis, unspecified site: Secondary | ICD-10-CM | POA: Diagnosis present

## 2014-08-20 DIAGNOSIS — M79671 Pain in right foot: Secondary | ICD-10-CM | POA: Diagnosis not present

## 2014-08-20 DIAGNOSIS — I348 Other nonrheumatic mitral valve disorders: Secondary | ICD-10-CM

## 2014-08-20 DIAGNOSIS — Z66 Do not resuscitate: Secondary | ICD-10-CM | POA: Diagnosis present

## 2014-08-20 DIAGNOSIS — I493 Ventricular premature depolarization: Secondary | ICD-10-CM | POA: Diagnosis present

## 2014-08-20 DIAGNOSIS — I700 Atherosclerosis of aorta: Secondary | ICD-10-CM | POA: Diagnosis present

## 2014-08-20 DIAGNOSIS — Z8249 Family history of ischemic heart disease and other diseases of the circulatory system: Secondary | ICD-10-CM | POA: Diagnosis not present

## 2014-08-20 DIAGNOSIS — E785 Hyperlipidemia, unspecified: Secondary | ICD-10-CM | POA: Diagnosis present

## 2014-08-20 DIAGNOSIS — E876 Hypokalemia: Secondary | ICD-10-CM | POA: Diagnosis not present

## 2014-08-20 DIAGNOSIS — D7582 Heparin induced thrombocytopenia (HIT): Secondary | ICD-10-CM | POA: Diagnosis not present

## 2014-08-20 DIAGNOSIS — N179 Acute kidney failure, unspecified: Secondary | ICD-10-CM | POA: Diagnosis not present

## 2014-08-20 DIAGNOSIS — I998 Other disorder of circulatory system: Secondary | ICD-10-CM | POA: Diagnosis not present

## 2014-08-20 DIAGNOSIS — I5041 Acute combined systolic (congestive) and diastolic (congestive) heart failure: Secondary | ICD-10-CM | POA: Diagnosis present

## 2014-08-20 DIAGNOSIS — E46 Unspecified protein-calorie malnutrition: Secondary | ICD-10-CM | POA: Diagnosis present

## 2014-08-20 DIAGNOSIS — D62 Acute posthemorrhagic anemia: Secondary | ICD-10-CM | POA: Diagnosis not present

## 2014-08-20 DIAGNOSIS — I10 Essential (primary) hypertension: Secondary | ICD-10-CM

## 2014-08-20 DIAGNOSIS — I214 Non-ST elevation (NSTEMI) myocardial infarction: Secondary | ICD-10-CM | POA: Diagnosis present

## 2014-08-20 DIAGNOSIS — F17210 Nicotine dependence, cigarettes, uncomplicated: Secondary | ICD-10-CM | POA: Diagnosis present

## 2014-08-20 DIAGNOSIS — I129 Hypertensive chronic kidney disease with stage 1 through stage 4 chronic kidney disease, or unspecified chronic kidney disease: Secondary | ICD-10-CM | POA: Diagnosis present

## 2014-08-20 DIAGNOSIS — Z6822 Body mass index (BMI) 22.0-22.9, adult: Secondary | ICD-10-CM | POA: Diagnosis not present

## 2014-08-20 DIAGNOSIS — I959 Hypotension, unspecified: Secondary | ICD-10-CM | POA: Diagnosis not present

## 2014-08-20 DIAGNOSIS — I272 Other secondary pulmonary hypertension: Secondary | ICD-10-CM | POA: Diagnosis present

## 2014-08-20 DIAGNOSIS — I517 Cardiomegaly: Secondary | ICD-10-CM

## 2014-08-20 DIAGNOSIS — I420 Dilated cardiomyopathy: Secondary | ICD-10-CM | POA: Diagnosis present

## 2014-08-20 DIAGNOSIS — J9811 Atelectasis: Secondary | ICD-10-CM | POA: Diagnosis not present

## 2014-08-20 DIAGNOSIS — I359 Nonrheumatic aortic valve disorder, unspecified: Secondary | ICD-10-CM

## 2014-08-20 DIAGNOSIS — I340 Nonrheumatic mitral (valve) insufficiency: Secondary | ICD-10-CM | POA: Diagnosis not present

## 2014-08-20 DIAGNOSIS — I69354 Hemiplegia and hemiparesis following cerebral infarction affecting left non-dominant side: Secondary | ICD-10-CM | POA: Diagnosis not present

## 2014-08-20 DIAGNOSIS — R0602 Shortness of breath: Secondary | ICD-10-CM | POA: Diagnosis present

## 2014-08-20 DIAGNOSIS — I341 Nonrheumatic mitral (valve) prolapse: Secondary | ICD-10-CM | POA: Diagnosis present

## 2014-08-20 LAB — CBC
HEMATOCRIT: 37.2 % — AB (ref 39.0–52.0)
Hemoglobin: 13.2 g/dL (ref 13.0–17.0)
MCH: 31.4 pg (ref 26.0–34.0)
MCHC: 35.5 g/dL (ref 30.0–36.0)
MCV: 88.6 fL (ref 78.0–100.0)
Platelets: 287 10*3/uL (ref 150–400)
RBC: 4.2 MIL/uL — AB (ref 4.22–5.81)
RDW: 15.2 % (ref 11.5–15.5)
WBC: 8.9 10*3/uL (ref 4.0–10.5)

## 2014-08-20 LAB — BASIC METABOLIC PANEL
Anion gap: 10 (ref 5–15)
BUN: 48 mg/dL — ABNORMAL HIGH (ref 6–23)
CALCIUM: 8.6 mg/dL (ref 8.4–10.5)
CO2: 21 mmol/L (ref 19–32)
CREATININE: 1.77 mg/dL — AB (ref 0.50–1.35)
Chloride: 110 mmol/L (ref 96–112)
GFR calc Af Amer: 42 mL/min — ABNORMAL LOW (ref >90)
GFR calc non Af Amer: 36 mL/min — ABNORMAL LOW (ref >90)
Glucose, Bld: 79 mg/dL (ref 70–99)
POTASSIUM: 3.9 mmol/L (ref 3.5–5.1)
Sodium: 141 mmol/L (ref 135–145)

## 2014-08-20 LAB — HEPARIN LEVEL (UNFRACTIONATED)
Heparin Unfractionated: 0.28 IU/mL — ABNORMAL LOW (ref 0.30–0.70)
Heparin Unfractionated: 0.11 IU/mL — ABNORMAL LOW (ref 0.30–0.70)

## 2014-08-20 LAB — TSH: TSH: 3.373 u[IU]/mL (ref 0.350–4.500)

## 2014-08-20 LAB — TROPONIN I: Troponin I: 0.49 ng/mL — ABNORMAL HIGH (ref <0.031)

## 2014-08-20 MED ORDER — SODIUM CHLORIDE 0.9 % IJ SOLN
3.0000 mL | Freq: Two times a day (BID) | INTRAMUSCULAR | Status: DC
  Administered 2014-08-22 – 2014-08-27 (×6): 3 mL via INTRAVENOUS

## 2014-08-20 MED ORDER — SODIUM CHLORIDE 0.9 % IJ SOLN: 3.0000 mL | INTRAMUSCULAR | Status: DC | PRN

## 2014-08-20 MED ORDER — HEPARIN BOLUS VIA INFUSION
1500.0000 [IU] | Freq: Once | INTRAVENOUS | Status: AC
  Administered 2014-08-20: 1500 [IU] via INTRAVENOUS
  Filled 2014-08-20: qty 1500

## 2014-08-20 MED ORDER — ASPIRIN EC 81 MG PO TBEC
81.0000 mg | DELAYED_RELEASE_TABLET | Freq: Every day | ORAL | Status: DC
  Administered 2014-08-20 – 2014-08-25 (×6): 81 mg via ORAL
  Filled 2014-08-20 (×6): qty 1

## 2014-08-20 MED ORDER — ATORVASTATIN CALCIUM 80 MG PO TABS
80.0000 mg | ORAL_TABLET | Freq: Every day | ORAL | Status: DC
  Administered 2014-08-20 – 2014-09-11 (×21): 80 mg via ORAL
  Filled 2014-08-20 (×25): qty 1

## 2014-08-20 MED ORDER — FUROSEMIDE 10 MG/ML IJ SOLN
40.0000 mg | Freq: Two times a day (BID) | INTRAMUSCULAR | Status: DC
  Administered 2014-08-20 – 2014-08-21 (×3): 40 mg via INTRAVENOUS
  Filled 2014-08-20 (×7): qty 4

## 2014-08-20 MED ORDER — ONDANSETRON HCL 4 MG/2ML IJ SOLN: 4.0000 mg | Freq: Four times a day (QID) | INTRAMUSCULAR | Status: DC | PRN

## 2014-08-20 MED ORDER — SODIUM CHLORIDE 0.9 % IV SOLN
250.0000 mL | INTRAVENOUS | Status: DC | PRN
  Administered 2014-08-24: 250 mL via INTRAVENOUS

## 2014-08-20 MED ORDER — METOPROLOL TARTRATE 25 MG PO TABS
25.0000 mg | ORAL_TABLET | Freq: Two times a day (BID) | ORAL | Status: DC
  Administered 2014-08-20 – 2014-08-21 (×2): 25 mg via ORAL
  Filled 2014-08-20 (×3): qty 1

## 2014-08-20 MED ORDER — ACETAMINOPHEN 325 MG PO TABS: 650.0000 mg | ORAL_TABLET | ORAL | Status: DC | PRN

## 2014-08-20 NOTE — Progress Notes (Signed)
  Echocardiogram 2D Echocardiogram has been performed.  Darren [name], Darren [name] 08/20/2014, 4:01 PM

## 2014-08-20 NOTE — Progress Notes (Signed)
PT Cancellation Note  Patient Details Name: Darren HarrisonRichard M [name] MRN: [id number] DOB: 04/04/1941   Cancelled Treatment:    Reason Eval/Treat Not Completed: Patient not medically ready (started on Heparin for NSTEMI/incr. trop. will check back  tomorrow for medicall readiness.)   [name], [name] 08/20/2014, 2:15 PM [name] PT [phone]

## 2014-08-20 NOTE — Progress Notes (Signed)
ANTICOAGULATION CONSULT NOTE Pharmacy Consult for Heparin Indication: chest pain/ACS  No Known Allergies  Patient Measurements:   Heparin Dosing Weight: 59 kg  Vital Signs: Temp: 97.8 F (36.6 C) (02/22 1935) Temp Source: Oral (02/22 1935) BP: 110/82 mmHg (02/23 0609) Pulse Rate: 75 (02/23 0609)  Labs:  Recent Labs  Feb 04, 2015 2052 08/20/14 0554 08/20/14 0555  HGB 14.0  --   --   HCT 41.1  --   --   PLT 320  --   --   HEPARINUNFRC  --   --  0.28*  CREATININE 1.86* 1.77*  --   TROPONINI 0.53*  --   --     Estimated Creatinine Clearance: 31.5 mL/min (by C-G formula based on Cr of 1.77).  Assessment: 74 y.o. male with CHF/NSTEMI for heparin  Goal of Therapy:  Heparin level 0.3-0.7 units/ml Monitor platelets by anticoagulation protocol: Yes   Plan:  Increase Heparin 850 units/hr Check heparin level in 8 hours.   Geannie RisenGreg Corbin Hott, PharmD, BCPS  08/20/2014,6:38 AM

## 2014-08-20 NOTE — Progress Notes (Signed)
     SUBJECTIVE: Breathing is about the same. No pain. Still in ED waiting on bed.   BP 110/82 mmHg  Pulse 75  Temp(Src) 97.8 F (36.6 C) (Oral)  Resp 17  SpO2 100%  Intake/Output Summary (Last 24 hours) at 08/20/14 1041 Last data filed at 08/20/14 0408  Gross per 24 hour  Intake      0 ml  Output   1600 ml  Net  -1600 ml    PHYSICAL EXAM General: Well developed, well nourished, in no acute distress. Alert and oriented x 3.  Psych:  Good affect, responds appropriately Neck: No JVD. No masses noted.  Lungs: Clear bilaterally with no wheezes or rhonci noted.  Heart: RRR with loud systolic murmur over apex.  Abdomen: Bowel sounds are present. Soft, non-tender.  Extremities: 2+ bilateral lower extremity edema.   LABS: Basic Metabolic Panel:  Recent Labs  16/03/9601/15/2016 2052 08/20/14 0554  NA 139 141  K 4.7 3.9  CL 110 110  CO2 20 21  GLUCOSE 99 79  BUN 49* 48*  CREATININE 1.86* 1.77*  CALCIUM 9.0 8.6   CBC:  Recent Labs  08/17/2014 2052 08/20/14 0554  WBC 8.2 8.9  NEUTROABS 6.0  --   HGB 14.0 13.2  HCT 41.1 37.2*  MCV 88.6 88.6  PLT 320 287   Cardiac Enzymes:  Recent Labs  08/19/14 2052 08/20/14 0555  TROPONINI 0.53* 0.49*    Current Meds: . aspirin EC  81 mg Oral Daily  . atorvastatin  80 mg Oral q1800  . furosemide  40 mg Intravenous Q12H  . sodium chloride  3 mL Intravenous Q12H    ASSESSMENT AND PLAN:  1. Acute CHF: Known to have normal LV function in the past. Echo is pending today to reassess. He is receiving IV Lasix. Negative 1.6 liters. Will continue IV Lasix for now and follow output and renal function closely.   2. Elevated troponin: Given symptoms, this may represent a Non-STEMI. Will continue IV heparin, ASA and statin. Will likely need cardiac cath to define CAD but will have to make sure renal function is stable. Add beta blocker.   3. Acute renal failure: Follow with diuresis. Baseline creatine 1.6.  4. HTN: BP controlled  5.  HLD: On statin  6. Mitral regurgitation: History of moderate MR by echo 2008. He was seen by Dr. Eldridge DaceVaranasi then but has not had recent cardiac follow up. Echo today to reassess.   Darren [name]  2/23/201610:41 AM

## 2014-08-20 NOTE — Progress Notes (Signed)
PHARMACY NOTE  Pharmacy Consult :  74 y.o. male is currently on Heparin infusion for chest pain/ACS.   Heparin Dosing Wt :  59 kg  Labs  February 28, 2015 2052 08/20/14 0554 08/20/14 0555 08/20/14 1700  HGB 14.0 13.2  --   --   HCT 41.1 37.2*  --   --   PLT 320 287  --   --   HEPARINUNFRC  --   --  0.28* 0.11*  CREATININE 1.86* 1.77*  --   --     Current Medication[s] Include: Infusion[s]: Infusions:  . heparin 850 Units/hr (08/20/14 0655)   Assessment :  Heparin infusing at 850 units/hr.    Heparin level subtherapeutic at 0.11 units/hr.  No evidence of bleeding complications observed.  Goal :  Heparin goal is Heparin level 0.3-0.7 units/ml.  Plan : 1. Heparin bolus 1500 units IV now, then increase Heparin infusion to 1050 units/hr.  The next Heparin Level will be drawn at 01:00 AM.    2. Daily Heparin level, CBC while on Heparin.  Monitor for bleeding complications. Follow Platelet counts.  Lilly Gasser, Elisha HeadlandEarle J,  Pharm.D  08/20/2014  5:44 PM

## 2014-08-21 DIAGNOSIS — R7989 Other specified abnormal findings of blood chemistry: Secondary | ICD-10-CM | POA: Diagnosis present

## 2014-08-21 DIAGNOSIS — R778 Other specified abnormalities of plasma proteins: Secondary | ICD-10-CM | POA: Diagnosis present

## 2014-08-21 DIAGNOSIS — I420 Dilated cardiomyopathy: Secondary | ICD-10-CM

## 2014-08-21 DIAGNOSIS — I5023 Acute on chronic systolic (congestive) heart failure: Secondary | ICD-10-CM

## 2014-08-21 LAB — CBC
HCT: 34.9 % — ABNORMAL LOW (ref 39.0–52.0)
HEMOGLOBIN: 11.7 g/dL — AB (ref 13.0–17.0)
MCH: 30 pg (ref 26.0–34.0)
MCHC: 33.5 g/dL (ref 30.0–36.0)
MCV: 89.5 fL (ref 78.0–100.0)
PLATELETS: 313 10*3/uL (ref 150–400)
RBC: 3.9 MIL/uL — AB (ref 4.22–5.81)
RDW: 15.3 % (ref 11.5–15.5)
WBC: 5.9 10*3/uL (ref 4.0–10.5)

## 2014-08-21 LAB — HEPARIN LEVEL (UNFRACTIONATED)
HEPARIN UNFRACTIONATED: 0.88 [IU]/mL — AB (ref 0.30–0.70)
Heparin Unfractionated: 0.53 IU/mL (ref 0.30–0.70)

## 2014-08-21 MED ORDER — METOPROLOL TARTRATE 12.5 MG HALF TABLET
12.5000 mg | ORAL_TABLET | Freq: Two times a day (BID) | ORAL | Status: DC
  Administered 2014-08-22: 12.5 mg via ORAL
  Filled 2014-08-21 (×6): qty 1

## 2014-08-21 NOTE — Evaluation (Signed)
Physical Therapy Evaluation Patient Details Name: Darren [name] MRN: [id number] DOB: 10-Mar-1941 Today's Date: 08/21/2014   History of Present Illness  35M smoker with HTN, HLD, GERD, CVA, CKD (baseline Cr 1.6), with L3 compression fracture s/p kyphoplasty on 2/10and history of MR who presents with new onset CHF and NSTEMI.  Clinical Impression  Pt admitted with above diagnosis. Pt currently with functional limitations due to the deficits listed below (see PT Problem List).  Pt will benefit from skilled PT to increase their independence and safety with mobility to allow discharge to the venue listed below.       Follow Up Recommendations Home health PT;Supervision/Assistance - 24 hour    Equipment Recommendations  None recommended by PT    Recommendations for Other Services       Precautions / Restrictions Precautions Precautions: None      Mobility  Bed Mobility Overal bed mobility: Needs Assistance Bed Mobility: Supine to Sit;Sit to Supine     Supine to sit: Min assist Sit to supine: Min assist   General bed mobility comments: assist to power up  Transfers Overall transfer level: Needs assistance Equipment used: Rolling walker (2 wheeled) Transfers: Sit to/from Stand Sit to Stand: Min assist         General transfer comment: verbal cues for hand placement  Ambulation/Gait Ambulation/Gait assistance: Min guard Ambulation Distance (Feet): 20 Feet Assistive device: Rolling walker (2 wheeled) Gait Pattern/deviations: Step-through pattern;Decreased stride length   Gait velocity interpretation: Below normal speed for age/gender    Stairs            Wheelchair Mobility    Modified Rankin (Stroke Patients Only)       Balance Overall balance assessment: Needs assistance Sitting-balance support: No upper extremity supported;Feet supported Sitting balance-Leahy Scale: Good     Standing balance support: Bilateral upper extremity supported;During  functional activity Standing balance-Leahy Scale: Poor                               Pertinent Vitals/Pain Pain Assessment: No/denies pain    Home Living Family/patient expects to be discharged to:: Private residence Living Arrangements: Children Available Help at Discharge: Family;Available 24 hours/day Type of Home: House Home Access: Stairs to enter Entrance Stairs-Rails: Right;Left;Can reach both Entrance Stairs-Number of Steps: 4 Home Layout: One level Home Equipment: Walker - 2 wheels;Shower seat      Prior Function Level of Independence: Independent with assistive device(s)               Hand Dominance        Extremity/Trunk Assessment   Upper Extremity Assessment: Generalized weakness           Lower Extremity Assessment: Generalized weakness      Cervical / Trunk Assessment: Normal  Communication   Communication: No difficulties  Cognition Arousal/Alertness: Awake/alert Behavior During Therapy: WFL for tasks assessed/performed Overall Cognitive Status: Within Functional Limits for tasks assessed                      General Comments      Exercises        Assessment/Plan    PT Assessment Patient needs continued PT services  PT Diagnosis Difficulty walking;Generalized weakness   PT Problem List Decreased strength;Decreased activity tolerance;Decreased balance;Decreased mobility;Cardiopulmonary status limiting activity;Decreased safety awareness  PT Treatment Interventions Gait training;Stair training;Functional mobility training;Therapeutic activities;Therapeutic exercise;Patient/family education;Balance training   PT Goals (Current  goals can be found in the Care Plan section) Acute Rehab PT Goals Patient Stated Goal: drive [place] and [place] PT Goal Formulation: With patient Time For Goal Achievement: 09/04/14 Potential to Achieve Goals: Good    Frequency Min 3X/week   Barriers to discharge         Co-evaluation               End of Session Equipment Utilized During Treatment: Gait belt Activity Tolerance: Patient limited by fatigue Patient left: in bed;with family/visitor present;with call bell/phone within reach Nurse Communication: Mobility status         Time: [id number] PT Time Calculation (min) (ACUTE ONLY): 18 min   Charges:   PT Evaluation $Initial PT Evaluation Tier I: 1 Procedure     PT G CodesIlda [name]:        Darren [name] Darren [name] 08/21/2014, 10:42 AM

## 2014-08-21 NOTE — Progress Notes (Signed)
Patient Name: Darren [name] Date of Encounter: 08/21/2014  Principal Problem:   Acute heart failure Active Problems:   Elevated troponin   Primary Cardiologist: Dr. Clifton [name]  Patient Profile:  60M smoker with HTN, HLD, GERD, CVA, CKD (baseline Cr 1.6), with L3 compression fracture s/p kyphoplasty on 2/10and history of MR admitted 02/22 with new onset CHF and ?NSTEMI.   SUBJECTIVE: No chest pain, no SOB today. Has not been OOB much. Limited activity for a long time before admission due to back problems>>surgery on 02/11>>recovery from surgery. Still smokes 2-3 cigs/day. No plans to quit  OBJECTIVE Filed Vitals:   08/20/14 2125 08/21/14 0120 08/21/14 0500 08/21/14 0900  BP: 92/69 94/64 94/64  93/61  Pulse: 69 66 67 103  Temp: 98.6 F (37 C) 97.8 F (36.6 C) 98.3 F (36.8 C) 97.6 F (36.4 C)  TempSrc: Oral Oral Oral Oral  Resp: 18 18 18 18   Height:      Weight:   125 lb 9.6 oz (56.972 kg)   SpO2: 99% 100% 100% 100%    Intake/Output Summary (Last 24 hours) at 08/21/14 1239 Last data filed at 08/21/14 1000  Gross per 24 hour  Intake    240 ml  Output   1975 ml  Net  -1735 ml   Filed Weights   08/20/14 1249 08/21/14 0500  Weight: 128 lb 15.5 oz (58.5 kg) 125 lb 9.6 oz (56.972 kg)    PHYSICAL EXAM General: Well developed, well nourished, male in no acute distress. Head: Normocephalic, atraumatic.  Neck: Supple without bruits, JVD 8-9 cm. Lungs:  Resp regular and unlabored, rales bases. Heart: RRR, S1, S2, no S3, S4, 3/6 murmur; no rub. Abdomen: Soft, non-tender, non-distended, BS + x 4.  Extremities: No clubbing, cyanosis, 1-2+ edema L>>R Neuro: Alert and oriented X 3. Moves all extremities spontaneously. Psych: Normal affect.  LABS: CBC:  Recent Labs  07/31/2014 2052 08/20/14 0554 08/21/14 0649  WBC 8.2 8.9 5.9  NEUTROABS 6.0  --   --   HGB 14.0 13.2 11.7*  HCT 41.1 37.2* 34.9*  MCV 88.6 88.6 89.5  PLT 320 287 313   Basic Metabolic  Panel:  Recent Labs  08/24/2014 2052 08/20/14 0554  NA 139 141  K 4.7 3.9  CL 110 110  CO2 20 21  GLUCOSE 99 79  BUN 49* 48*  CREATININE 1.86* 1.77*  CALCIUM 9.0 8.6   Liver Function Tests:  Recent Labs  07/31/2014 2052  AST 32  ALT 39  ALKPHOS 86  BILITOT 0.9  PROT 6.2  ALBUMIN 3.3*   Cardiac Enzymes:  Recent Labs  08/25/2014 2052 08/20/14 0555  TROPONINI 0.53* 0.49*   BNP:  B NATRIURETIC PEPTIDE  Date/Time Value Ref Range Status  07/30/2014 08:52 PM 1548.8* 0.0 - 100.0 pg/mL Final   PRO B NATRIURETIC PEPTIDE (BNP)  Date/Time Value Ref Range Status  06/16/2007 04:45 PM <30.0  Final   Thyroid Function Tests:  Recent Labs  08/20/14 0555  TSH 3.373   TELE:  SR, PVCs    ECHO: 08/20/2014 Conclusions - Left ventricle: The cavity size was normal. Wall thickness was normal. Systolic function was mildly to moderately reduced. The estimated ejection fraction was in the range of 40% to 45%. Wall motion was normal; there were no regional wall motion abnormalities. - Aortic valve: There was mild regurgitation. - Mitral valve: Prolapse, involving the posterior leaflet. There was severe regurgitation. - Left atrium: The atrium was severely dilated. - Right atrium:  The atrium was moderately dilated. - Pericardium, extracardiac: There was a left pleural effusion.  Radiology/Studies: Dg Chest 2 View 08/16/2014   CLINICAL DATA:  Bilateral leg swelling. Back surgery 2 weeks ago. Persistent shortness of breath. Weakness.  EXAM: CHEST  2 VIEW  COMPARISON:  08/07/2014  FINDINGS: Moderate enlargement of the cardiopericardial silhouette. Atherosclerotic calcification of the aortic arch.  Airspace opacity in the lingula and left lower lobe with some associated adjacent pleural effusion. Suspected small right pleural effusion in the posterior costophrenic angle as well.  IMPRESSION: 1. Airspace opacity at the left lung base in excess of the passive atelectasis from the  pleural effusion. Appearance could reflect atelectasis or pneumonia. 2. Small left and trace right pleural effusion. 3. Moderate enlargement of the cardiopericardial silhouette.   Electronically Signed   By: Darren [name] M.D.   On: 08/08/2014 21:54    Current Medications:  . aspirin EC  81 mg Oral Daily  . atorvastatin  80 mg Oral q1800  . furosemide  40 mg Intravenous Q12H  . metoprolol tartrate  25 mg Oral BID  . sodium chloride  3 mL Intravenous Q12H   . heparin 900 Units/hr (08/21/14 0203)    ASSESSMENT AND PLAN: Principal Problem:   Acute heart failure - continue IV Lasix today, likely can change to PO rx in am - follow I/O, renal function weights  Active Problems:   Elevated troponin - No chest pain but activity was limited for a long time prior to admission - EF slightly low, but no WMA - his ?son, whom he lives with, Mr Darren [name], had cath complication, probably dissection, that gave him MI, the family is reluctant to have another cath. - ASA, BB, statin    Tobacco use - cessation encouraged, unlikely  Severe MR  Signed, Darren [name] , PA-C 12:39 PM 08/21/2014   I have personally seen and examined this patient with Darren Demark, PA-C.  I agree with the assessment and plan as outlined above. He is diuresing well but still volume overloaded. He has severe MR now and this is likely contributing to his LV dysfunction. He needs TEE and right and left heart cath to better assess MR but reluctant to consider. Will need MVR. He does not wish to think about open heart surgery. Will continue current plan and discuss again tomorrow.   Darren [name] 08/21/2014 1:52 PM

## 2014-08-21 NOTE — Care Management Note (Signed)
    Page 1 of 1   09/13/2014     7:03:55 AM CARE MANAGEMENT NOTE 09/13/2014  Patient:  Darren [name],Darren [name]   Account Number:  [id number]  Date Initiated:  08/21/2014  Documentation initiated by:  AMERSON,JULIE  Subjective/Objective Assessment:   Pt adm on 08/07/2014 with CHF, NSTEMI.  PTA, pt resides at home with family.     Action/Plan:   Will follow for dc needs as pt progresses.  PT recommending HHPT.  08/27/2014 Plan now for CABG await scheduling.   Anticipated DC Date:  09/03/2014   Anticipated DC Plan:  HOME W HOME HEALTH SERVICES      DC Planning Services  CM consult      Choice offered to / List presented to:             Status of service:  Completed, signed off Medicare Important Message given?  YES (If response is "NO", the following Medicare IM given date fields will be blank) Date Medicare IM given:  09/09/2014 Medicare IM given by:  Nica Friske Date Additional Medicare IM given:   Additional Medicare IM given by:    Discharge Disposition:  EXPIRED  Per UR Regulation:  Reviewed for med. necessity/level of care/duration of stay  If discussed at Long Length of Stay Meetings, dates discussed:    Comments:  Contact:  Josefine ClassMorphis,Rita Other [phone]      Astrid DraftsMiller,Rhonda Daughter  [phone]  09/17/2014 1444 Lacora Folmer RN MSN BSN CCM Dtrs expressing their concern that they will be unable to care for pt and that he will need long-term placement. Contacted CSW per their request to obtain list of facilities contracted with his insurance.  09/09/14 1150 Jeena Arnett RN MSN BSN CCM Dtrs in room with pt, now has ischemic (R) foot but refusing recommended BKA.  Pt lives with one of his dtrs but will need SNF for rehab.  Remains on angiomax gtt.   09-02-14 8am Avie ArenasSarah Brown, RNBSN [phone] Post op CABG x4 and MVR on 08/28/2014.  Continues with fluid overload - on dopamine.

## 2014-08-21 NOTE — Progress Notes (Signed)
ANTICOAGULATION CONSULT NOTE - Follow Up Consult  Pharmacy Consult for Heparin Indication: chest pain/ACS  No Known Allergies  Patient Measurements: Height: 5\' 7"  (170.2 cm) Weight: 125 lb 9.6 oz (56.972 kg) (scale B) IBW/kg (Calculated) : 66.1 Heparin Dosing Weight: 59 kg  Vital Signs: Temp: 97.6 F (36.4 C) (02/24 0900) Temp Source: Oral (02/24 0900) BP: 93/61 mmHg (02/24 0900) Pulse Rate: 103 (02/24 0900)  Labs:  Recent Labs  08/14/2014 2052 08/20/14 0554  08/20/14 0555 08/20/14 1700 08/21/14 0115 08/21/14 0649 08/21/14 0928  HGB 14.0 13.2  --   --   --   --  11.7*  --   HCT 41.1 37.2*  --   --   --   --  34.9*  --   PLT 320 287  --   --   --   --  313  --   HEPARINUNFRC  --   --   < > 0.28* 0.11* 0.88*  --  0.53  CREATININE 1.86* 1.77*  --   --   --   --   --   --   TROPONINI 0.53*  --   --  0.49*  --   --   --   --   < > = values in this interval not displayed.  Estimated Creatinine Clearance: 30 mL/min (by C-G formula based on Cr of 1.77).   Assessment: 74yo male presents with leg swelling. Trop 0.53, BNP 1548.8, Hgb 14, Plt 320, sCr 1.86.  PMH: HTN, HLD, GERD, CVA, CKD (baseline Cr 1.6), with L3 compression fracture s/p kyphoplasty on 2/10 and history of MR   Anticoagulation: heparin for r/o ACS. Heparin level 0.53 now in goal. Baseline Hgb 14>>1..7 today. Plts stable.  Cardiovascular: new onset HF likely due to MR and likely NSTEMI w/ tp max of 0.53, BNP 1548 . EF 40-45%. BP 93/61 with HR 103. I/O -975/24h Meds: ASA81, Lipitor80, IV Lasix, metoprolol  Endocrinology: glucose ok  Nephrology: SCr= 1.77, CrCl~ 30  PTA Medication Issues: none? (was on ACE and statin in the past but pt no longer taking)  Best Practices: hep gtt  Goal of Therapy:  Heparin level 0.3-0.7 units/ml Monitor platelets by anticoagulation protocol: Yes   Plan:  Continue IV heparin at 900 units/hr Daily HL and CBC   Kinzlie Harney S. Merilynn Finlandobertson, PharmD, BCPS Clinical Staff  Pharmacist Pager [phone]  Misty Stanleyobertson, Lajune Perine Stillinger 08/21/2014,10:31 AM

## 2014-08-21 NOTE — Progress Notes (Signed)
ANTICOAGULATION CONSULT NOTE Pharmacy Consult for Heparin Indication: chest pain/ACS  No Known Allergies  Patient Measurements: Height: 5\' 7"  (170.2 cm) Weight: 128 lb 15.5 oz (58.5 kg) IBW/kg (Calculated) : 66.1 Heparin Dosing Weight: 59 kg  Vital Signs: Temp: 97.8 F (36.6 C) (02/24 0120) Temp Source: Oral (02/24 0120) BP: 94/64 mmHg (02/24 0120) Pulse Rate: 66 (02/24 0120)  Labs:  Recent Labs  08/18/2014 2052 08/20/14 0554 08/20/14 0555 08/20/14 1700 08/21/14 0115  HGB 14.0 13.2  --   --   --   HCT 41.1 37.2*  --   --   --   PLT 320 287  --   --   --   HEPARINUNFRC  --   --  0.28* 0.11* 0.88*  CREATININE 1.86* 1.77*  --   --   --   TROPONINI 0.53*  --  0.49*  --   --     Estimated Creatinine Clearance: 30.8 mL/min (by C-G formula based on Cr of 1.77).  Assessment: 74 y.o. male with CHF/NSTEMI for heparin  Goal of Therapy:  Heparin level 0.3-0.7 units/ml Monitor platelets by anticoagulation protocol: Yes   Plan:  Decrease Heparin 900 units/hr Follow-up am labs.  Geannie RisenGreg Terrilee Dudzik, PharmD, BCPS  08/21/2014,1:54 AM

## 2014-08-22 ENCOUNTER — Encounter (HOSPITAL_COMMUNITY): Payer: Self-pay | Admitting: General Practice

## 2014-08-22 DIAGNOSIS — I5021 Acute systolic (congestive) heart failure: Secondary | ICD-10-CM

## 2014-08-22 DIAGNOSIS — I340 Nonrheumatic mitral (valve) insufficiency: Secondary | ICD-10-CM | POA: Diagnosis present

## 2014-08-22 DIAGNOSIS — I420 Dilated cardiomyopathy: Secondary | ICD-10-CM | POA: Diagnosis present

## 2014-08-22 LAB — CBC
HCT: 33.4 % — ABNORMAL LOW (ref 39.0–52.0)
Hemoglobin: 11.3 g/dL — ABNORMAL LOW (ref 13.0–17.0)
MCH: 30 pg (ref 26.0–34.0)
MCHC: 33.8 g/dL (ref 30.0–36.0)
MCV: 88.6 fL (ref 78.0–100.0)
Platelets: 285 10*3/uL (ref 150–400)
RBC: 3.77 MIL/uL — ABNORMAL LOW (ref 4.22–5.81)
RDW: 15 % (ref 11.5–15.5)
WBC: 5.6 10*3/uL (ref 4.0–10.5)

## 2014-08-22 LAB — BASIC METABOLIC PANEL
Anion gap: 6 (ref 5–15)
BUN: 35 mg/dL — AB (ref 6–23)
CO2: 29 mmol/L (ref 19–32)
CREATININE: 1.5 mg/dL — AB (ref 0.50–1.35)
Calcium: 7.9 mg/dL — ABNORMAL LOW (ref 8.4–10.5)
Chloride: 102 mmol/L (ref 96–112)
GFR calc Af Amer: 52 mL/min — ABNORMAL LOW (ref >90)
GFR, EST NON AFRICAN AMERICAN: 44 mL/min — AB (ref >90)
Glucose, Bld: 86 mg/dL (ref 70–99)
Potassium: 2.9 mmol/L — ABNORMAL LOW (ref 3.5–5.1)
Sodium: 137 mmol/L (ref 135–145)

## 2014-08-22 LAB — HEPARIN LEVEL (UNFRACTIONATED): HEPARIN UNFRACTIONATED: 0.41 [IU]/mL (ref 0.30–0.70)

## 2014-08-22 MED ORDER — SODIUM CHLORIDE 0.9 % IV BOLUS (SEPSIS)
200.0000 mL | Freq: Once | INTRAVENOUS | Status: AC
  Administered 2014-08-22: 200 mL via INTRAVENOUS

## 2014-08-22 NOTE — Discharge Summary (Signed)
Patient ID: Darren [name] MRN: [id number] DOB/AGE: 74/06/1940 74 y.o.  Admit date: 08/07/2014 Discharge date: 08/08/2014  Admission Diagnoses:  Active Problems:   Compression fracture   Discharge Diagnoses:  Same  Past Medical History  Diagnosis Date  . Hypertension   . Hyperlipidemia   . GERD (gastroesophageal reflux disease)   . Carotid bruit   . Stroke     uses walker  . Shortness of breath dyspnea     with exertion   . Arthritis   . CHF (congestive heart failure) 07/2014    " NEW ONSET "    Surgeries: Procedure(s):  Lumbar Three Kyphoplasty on 08/07/2014   Consultants: None  Discharged Condition: Improved  Hospital Course: Darren [name] is an 74 y.o. male who was admitted 08/07/2014 for operative treatment of compression fracture. Patient has severe unremitting pain that affects sleep, daily activities, and work/hobbies. After pre-op clearance the patient was taken to the operating room on 08/07/2014 and underwent  Procedure(s):  Lumbar Three Kyphoplasty.    Patient was given perioperative antibiotics:  Anti-infectives    Start     Dose/Rate Route Frequency Ordered Stop   08/07/14 2200  ceFAZolin (ANCEF) IVPB 1 g/50 mL premix     1 g 100 mL/hr over 30 Minutes Intravenous Every 8 hours 08/07/14 1815 08/08/14 0617   08/07/14 0600  ceFAZolin (ANCEF) IVPB 2 g/50 mL premix     2 g 100 mL/hr over 30 Minutes Intravenous On call to O.R. 08/06/14 1409 08/07/14 1426       Patient was given sequential compression devices, early ambulation to prevent DVT.  Patient benefited maximally from hospital stay and there were no complications.    Recent vital signs: BP 107/62 mmHg  Pulse 79  Temp(Src) 98 F (36.7 C) (Oral)  Resp 18  Ht 5\' 6"  (1.676 m)  Wt 59.875 kg (132 lb)  BMI 21.32 kg/m2  SpO2 98%  Discharge Medications:     Medication List    Notice    You have not been prescribed any medications.      Diagnostic Studies: Dg Chest 2  View  07/30/2014   CLINICAL DATA:  Bilateral leg swelling. Back surgery 2 weeks ago. Persistent shortness of breath. Weakness.  EXAM: CHEST  2 VIEW  COMPARISON:  08/07/2014  FINDINGS: Moderate enlargement of the cardiopericardial silhouette. Atherosclerotic calcification of the aortic arch.  Airspace opacity in the lingula and left lower lobe with some associated adjacent pleural effusion. Suspected small right pleural effusion in the posterior costophrenic angle as well.  IMPRESSION: 1. Airspace opacity at the left lung base in excess of the passive atelectasis from the pleural effusion. Appearance could reflect atelectasis or pneumonia. 2. Small left and trace right pleural effusion. 3. Moderate enlargement of the cardiopericardial silhouette.   Electronically Signed   By: Gaylyn RongWalter  Liebkemann M.D.   On: 08/18/2014 21:54   Dg Chest 2 View  08/07/2014   CLINICAL DATA:  Preop for lumbar surgery.  EXAM: CHEST  2 VIEW  COMPARISON:  08/11/2007  FINDINGS: Small bilateral pleural effusions. Borderline cardiomegaly. There is moderate aortic tortuosity which is stable from prior. Stable calcification overlapping the heart, valvular versus coronary. Mild atelectasis in the lower lungs. No convincing pneumonia or edema.  IMPRESSION: Small bilateral pleural effusion and mild basilar atelectasis.   Electronically Signed   By: Marnee SpringJonathon  Watts M.D.   On: 08/07/2014 10:44   Dg Lumbar Spine 2-3 Views  08/07/2014   CLINICAL DATA:  L3 kyphoplasty  EXAM: DG C-ARM 61-120 MIN; LUMBAR SPINE - 2-3 VIEW  TECHNIQUE: AP and lateral fluoroscopic spot views  FLUOROSCOPY TIME:  Radiation Exposure Index (as provided by the fluoroscopic device):  If the device does not provide the exposure index:  Fluoroscopy Time (in minutes and seconds):  2 minutes and 16 seconds  Number of Acquired Images:  2  COMPARISON:  07/17/2014  FINDINGS: Mild compression deformity at L3 status post kyphoplasty. Cement projects over the vertebral body.  IMPRESSION:  Anticipated appearance status post kyphoplasty.   Electronically Signed   By: Esperanza Heir M.D.   On: 08/07/2014 16:00   Dg C-arm 1-60 Min  08/07/2014   CLINICAL DATA:  L3 kyphoplasty  EXAM: DG C-ARM 61-120 MIN; LUMBAR SPINE - 2-3 VIEW  TECHNIQUE: AP and lateral fluoroscopic spot views  FLUOROSCOPY TIME:  Radiation Exposure Index (as provided by the fluoroscopic device):  If the device does not provide the exposure index:  Fluoroscopy Time (in minutes and seconds):  2 minutes and 16 seconds  Number of Acquired Images:  2  COMPARISON:  07/17/2014  FINDINGS: Mild compression deformity at L3 status post kyphoplasty. Cement projects over the vertebral body.  IMPRESSION: Anticipated appearance status post kyphoplasty.   Electronically Signed   By: Esperanza Heir M.D.   On: 08/07/2014 16:00    Disposition: 01-Home or Self Care   POD #1 s/p L3 kyphoplasty, doing great  - encourage ambulation -Written scripts for pain signed and in chart -D/C instructions sheet printed and in chart -D/C today  -F/U in office 2 weeks   Signed: Georga Bora 08/22/2014, 12:36 PM

## 2014-08-22 NOTE — Progress Notes (Addendum)
Patient Name: Darren HarrisonRichard M Olafson Date of Encounter: 08/22/2014  Principal Problem:   Acute heart failure Active Problems:   Elevated troponin   Primary Cardiologist: Dr. Clifton JamesMcAlhany  Patient Profile:  59M smoker with HTN, HLD, GERD, CVA, CKD (baseline Cr 1.6), with L3 compression fracture s/p kyphoplasty on 2/10and history of MR admitted 02/22 with new onset CHF and ?NSTEMI.   SUBJECTIVE: No CP or SOB. Says his breathing is stable from yesterday. Stilling thinking about MVR and studies that would be required to proceed with this. He has not made any decisions.   OBJECTIVE Filed Vitals:   08/22/14 0139 08/22/14 0624 08/22/14 0800 08/22/14 1048  BP: 90/66 93/50 84/60  84/60  Pulse: 68 59  59  Temp: 97.6 F (36.4 C) 98 F (36.7 C)  97.8 F (36.6 C)  TempSrc: Oral Oral  Oral  Resp: 16 18  18   Height:      Weight:  126 lb 3.2 oz (57.244 kg)    SpO2: 100% 100%  100%    Intake/Output Summary (Last 24 hours) at 08/22/14 1149 Last data filed at 08/22/14 1055  Gross per 24 hour  Intake   1614 ml  Output   1200 ml  Net    414 ml   Filed Weights   08/20/14 1249 08/21/14 0500 08/22/14 0624  Weight: 128 lb 15.5 oz (58.5 kg) 125 lb 9.6 oz (56.972 kg) 126 lb 3.2 oz (57.244 kg)    PHYSICAL EXAM General: Well developed, well nourished, male in no acute distress. Head: Normocephalic, atraumatic.  Neck: Supple without bruits, JVD 8-9 cm. Lungs:  Resp regular and unlabored, rales bases. Heart: RRR, S1, S2, no S3, S4, 3/6 murmur; no rub. Abdomen: Soft, non-tender, non-distended, BS + x 4.  Extremities: No clubbing, cyanosis, 1-2+ edema L>>R Neuro: Alert and oriented X 3. Moves all extremities spontaneously. Psych: Normal affect.  LABS: CBC:  Recent Labs  07/23/14 2052  08/21/14 0649 08/22/14 0412  WBC 8.2  < > 5.9 5.6  NEUTROABS 6.0  --   --   --   HGB 14.0  < > 11.7* 11.3*  HCT 41.1  < > 34.9* 33.4*  MCV 88.6  < > 89.5 88.6  PLT 320  < > 313 285  < > = values in  this interval not displayed. Basic Metabolic Panel:  Recent Labs  86/57/8402/23/16 0554 08/22/14 0838  NA 141 137  K 3.9 2.9*  CL 110 102  CO2 21 29  GLUCOSE 79 86  BUN 48* 35*  CREATININE 1.77* 1.50*  CALCIUM 8.6 7.9*   Liver Function Tests:  Recent Labs  07/23/14 2052  AST 32  ALT 39  ALKPHOS 86  BILITOT 0.9  PROT 6.2  ALBUMIN 3.3*   Cardiac Enzymes:  Recent Labs  07/23/14 2052 08/20/14 0555  TROPONINI 0.53* 0.49*   BNP:  B NATRIURETIC PEPTIDE  Date/Time Value Ref Range Status  2015-03-07 08:52 PM 1548.8* 0.0 - 100.0 pg/mL Final   PRO B NATRIURETIC PEPTIDE (BNP)  Date/Time Value Ref Range Status  06/16/2007 04:45 PM <30.0  Final   Thyroid Function Tests:  Recent Labs  08/20/14 0555  TSH 3.373   TELE:  SR, PVCs    ECHO: 08/20/2014 Conclusions - Left ventricle: The cavity size was normal. Wall thickness was normal. Systolic function was mildly to moderately reduced. The estimated ejection fraction was in the range of 40% to 45%. Wall motion was normal; there were no regional wall motion abnormalities. -  Aortic valve: There was mild regurgitation. - Mitral valve: Prolapse, involving the posterior leaflet. There was severe regurgitation. - Left atrium: The atrium was severely dilated. - Right atrium: The atrium was moderately dilated. - Pericardium, extracardiac: There was a left pleural effusion.  Radiology/Studies: Dg Chest 2 View 08/14/2014   CLINICAL DATA:  Bilateral leg swelling. Back surgery 2 weeks ago. Persistent shortness of breath. Weakness.  EXAM: CHEST  2 VIEW  COMPARISON:  08/07/2014  FINDINGS: Moderate enlargement of the cardiopericardial silhouette. Atherosclerotic calcification of the aortic arch.  Airspace opacity in the lingula and left lower lobe with some associated adjacent pleural effusion. Suspected small right pleural effusion in the posterior costophrenic angle as well.  IMPRESSION: 1. Airspace opacity at the left lung base  in excess of the passive atelectasis from the pleural effusion. Appearance could reflect atelectasis or pneumonia. 2. Small left and trace right pleural effusion. 3. Moderate enlargement of the cardiopericardial silhouette.   Electronically Signed   By: Gaylyn Rong M.D.   On: 08/09/2014 21:54    Current Medications:  . aspirin EC  81 mg Oral Daily  . atorvastatin  80 mg Oral q1800  . furosemide  40 mg Intravenous Q12H  . metoprolol tartrate  12.5 mg Oral BID WC  . sodium chloride  3 mL Intravenous Q12H   . heparin 900 Units/hr (08/21/14 2243)    ASSESSMENT AND PLAN:  Acute systolic CHF/Severe MR- EF 40-45% -- He has been diuresing well but felt to be still volume overloaded yesterday and continued on IV  Lasix BID. Net neg 3.7L. Weight down 2 lbs (128--> 126lbs). May be able to switch to PO today. MD to see.  -- He has severe MR now and this is likely contributing to his LV dysfunction. He needs TEE and right and left heart cath to better assess MR but reluctant to consider. Will need MVR. He does not wish to think about open heart surgery. Still thinking this over and will talk to Dr. Sanjuana Kava today.   Elevated troponin  (0.53--> 0.49) -- No chest pain but activity was limited for a long time prior to admission -- EF slightly low, but no WMA -- his ?son, whom he lives with, Mr Darren [name], had cath complication, probably dissection, that gave him MI, the family is reluctant to have another cath. -- ASA, BB, statin  Tobacco use -- Cessation encouraged, unlikely  CKD- baseline creat ~ 1.6 No BMET for 3 days.  BMET pending today  Signed, Cline Crock , PA-C 11:49 AM 08/22/2014   I have personally seen and examined this patient with Cline Crock, PA-C. I agree with the assessment and plan as outlined above. I have had another long discussion with the patient and his daughters this am. He has severe MR, LV dysfunction likely due to his MR and high probability of CAD. He  needs a right and left heart cath and TEE. He is still undecided as to how aggressive he wishes to be. Renal function prohibits cath today or tomorrow anyway. Will change to po Lasix. Will hold today's dose of Lasix.  BMET in am. If he wishes to be aggressive, anticipate cath and TEE early next week. He may not be a candidate for open heart surgery but would have to at least consider this.   Darren [name],Darren [name] 08/22/2014 11:56 AM

## 2014-08-22 NOTE — Progress Notes (Signed)
Notified by NT that patient's BP was 84/56. RN recheck was 82/52. Patient remains asymptomatic. Parameters to notify MD for SBP <85. Received order for 200cc NS bolus to be administered over three hours and recheck BP. Bolus is currently running. Patient is resting comfortably. Will continue to monitor. Blood pressure 82/52, pulse 66, temperature 97.6 F (36.4 C), temperature source Oral, resp. rate 16, height 5\' 7"  (1.702 m), weight 57.244 kg (126 lb 3.2 oz), SpO2 98 %. Troy SineWalker, Jarman Litton M

## 2014-08-22 NOTE — Progress Notes (Signed)
ANTICOAGULATION CONSULT NOTE - Follow Up Consult  Pharmacy Consult for Heparin Indication: chest pain/ACS  No Known Allergies  Patient Measurements: Height: 5\' 7"  (170.2 cm) Weight: 126 lb 3.2 oz (57.244 kg) (scale B) IBW/kg (Calculated) : 66.1 Heparin Dosing Weight: 59 kg  Vital Signs: Temp: 98 F (36.7 C) (02/25 0624) Temp Source: Oral (02/25 0624) BP: 93/50 mmHg (02/25 0624) Pulse Rate: 59 (02/25 0624)  Labs:  Recent Labs  08/17/2014 2052 08/20/14 0554 08/20/14 0555  08/21/14 0115 08/21/14 0649 08/21/14 0928 08/22/14 0412  HGB 14.0 13.2  --   --   --  11.7*  --  11.3*  HCT 41.1 37.2*  --   --   --  34.9*  --  33.4*  PLT 320 287  --   --   --  313  --  285  HEPARINUNFRC  --   --  0.28*  < > 0.88*  --  0.53 0.41  CREATININE 1.86* 1.77*  --   --   --   --   --   --   TROPONINI 0.53*  --  0.49*  --   --   --   --   --   < > = values in this interval not displayed.  Estimated Creatinine Clearance: 30.1 mL/min (by C-G formula based on Cr of 1.77).   Assessment: 74yo male presents with leg swelling. Trop 0.53, BNP 1548.8, Hgb 14, Plt 320, sCr 1.86.  PMH: HTN, HLD, GERD, CVA, CKD (baseline Cr 1.6), with L3 compression fracture s/p kyphoplasty on 2/10 and history of MR   Anticoagulation: heparin for r/o ACS. Heparin level 0.41 in goal. Baseline Hgb 14>>11.7>11.3 today. Plts stable.  Cardiovascular: new onset HF likely due to MR and likely NSTEMI w/ trop max of 0.53, BNP 1548 . EF 40-45%. Sever MR contributing to LV dysfunction (needs MVR, pt not currently considering). BP 93/50 with HR 59-103. I/O -1189/24h Meds: ASA81, Lipitor80, IV Lasix, metoprolol  Endocrinology: glucose ok  Nephrology: SCr= 1.77, CrCl~ 30  PTA Medication Issues: none? (was on ACE and statin in the past but pt no longer taking)  Best Practices: hep gtt  Goal of Therapy:  Heparin level 0.3-0.7 units/ml Monitor platelets by anticoagulation protocol: Yes   Plan:  Continue IV heparin at 900  units/hr Daily HL and CBC   Brittiney Dicostanzo S. Merilynn Finlandobertson, PharmD, BCPS Clinical Staff Pharmacist Pager [phone]  Misty Stanleyobertson, Courney Garrod Stillinger 08/22/2014,8:44 AM

## 2014-08-22 NOTE — Progress Notes (Signed)
   08/22/14 0624  Vitals  Temp 98 F (36.7 C)  Temp Source Oral  BP (!) 93/50 mmHg  BP Location Right Arm  BP Method Automatic  Patient Position (if appropriate) Lying  Pulse Rate (!) 59  Pulse Rate Source Dinamap  Resp 18  Oxygen Therapy  SpO2 100 %  O2 Device Room Air  Height and Weight  Weight 57.244 kg (126 lb 3.2 oz) (scale B)  Type of Scale Used Standing  Pt is scheduled to received lasix 40mg  IV and 12.5mg  of metoprolol PO. MD on call notified and was told to reschedule med at 0800.

## 2014-08-23 DIAGNOSIS — N183 Chronic kidney disease, stage 3 unspecified: Secondary | ICD-10-CM

## 2014-08-23 DIAGNOSIS — N179 Acute kidney failure, unspecified: Secondary | ICD-10-CM

## 2014-08-23 DIAGNOSIS — I5041 Acute combined systolic (congestive) and diastolic (congestive) heart failure: Secondary | ICD-10-CM

## 2014-08-23 DIAGNOSIS — E876 Hypokalemia: Secondary | ICD-10-CM

## 2014-08-23 DIAGNOSIS — D649 Anemia, unspecified: Secondary | ICD-10-CM

## 2014-08-23 LAB — CBC
HCT: 32.9 % — ABNORMAL LOW (ref 39.0–52.0)
HEMOGLOBIN: 10.9 g/dL — AB (ref 13.0–17.0)
MCH: 29.8 pg (ref 26.0–34.0)
MCHC: 33.1 g/dL (ref 30.0–36.0)
MCV: 89.9 fL (ref 78.0–100.0)
PLATELETS: 311 10*3/uL (ref 150–400)
RBC: 3.66 MIL/uL — ABNORMAL LOW (ref 4.22–5.81)
RDW: 15.1 % (ref 11.5–15.5)
WBC: 6.1 10*3/uL (ref 4.0–10.5)

## 2014-08-23 LAB — BASIC METABOLIC PANEL
ANION GAP: 6 (ref 5–15)
BUN: 34 mg/dL — ABNORMAL HIGH (ref 6–23)
CO2: 26 mmol/L (ref 19–32)
Calcium: 7.8 mg/dL — ABNORMAL LOW (ref 8.4–10.5)
Chloride: 102 mmol/L (ref 96–112)
Creatinine, Ser: 1.54 mg/dL — ABNORMAL HIGH (ref 0.50–1.35)
GFR calc non Af Amer: 43 mL/min — ABNORMAL LOW (ref >90)
GFR, EST AFRICAN AMERICAN: 50 mL/min — AB (ref >90)
GLUCOSE: 100 mg/dL — AB (ref 70–99)
POTASSIUM: 3.1 mmol/L — AB (ref 3.5–5.1)
SODIUM: 134 mmol/L — AB (ref 135–145)

## 2014-08-23 LAB — HEPARIN LEVEL (UNFRACTIONATED): Heparin Unfractionated: 0.42 IU/mL (ref 0.30–0.70)

## 2014-08-23 MED ORDER — POTASSIUM CHLORIDE CRYS ER 20 MEQ PO TBCR
40.0000 meq | EXTENDED_RELEASE_TABLET | Freq: Once | ORAL | Status: AC
  Administered 2014-08-23: 40 meq via ORAL
  Filled 2014-08-23: qty 2

## 2014-08-23 MED ORDER — POTASSIUM CHLORIDE CRYS ER 20 MEQ PO TBCR
20.0000 meq | EXTENDED_RELEASE_TABLET | Freq: Every day | ORAL | Status: DC
  Administered 2014-08-24 – 2014-08-28 (×4): 20 meq via ORAL
  Filled 2014-08-23 (×6): qty 1

## 2014-08-23 MED ORDER — SODIUM CHLORIDE 0.9 % IV SOLN: INTRAVENOUS | Status: DC

## 2014-08-23 NOTE — Progress Notes (Signed)
Physical Therapy Treatment Patient Details Name: Darren HarrisonRichard M [name] MRN: [id number] DOB: 08/06/1940 Today's Date: 08/23/2014    History of Present Illness 28M smoker with HTN, HLD, GERD, CVA, CKD (baseline Cr 1.6), with L3 compression fracture s/p kyphoplasty on 2/10and history of MR who presents with new onset CHF and NSTEMI.    PT Comments    Pt is progressing with mobility. Pt was able to ambulate in the hall 14600ft with RW min assist. Pt declined sitting in the recliner after therapy. I encouraged him to push himself a little so he can see some improvements with endurance and mobility. Pt would benefit from continued skilled PT until d/c home with daughter.  Follow Up Recommendations  Home health PT;Supervision/Assistance - 24 hour     Equipment Recommendations  None recommended by PT    Recommendations for Other Services       Precautions / Restrictions Precautions Precautions: Fall Precaution Comments: left foot drop with fatigue Restrictions Weight Bearing Restrictions: No    Mobility  Bed Mobility Overal bed mobility: Needs Assistance Bed Mobility: Supine to Sit     Supine to sit: Min assist Sit to supine: Min assist   General bed mobility comments: trunk support to for initial rise up, assistance lifting LEs into the bed  Transfers Overall transfer level: Needs assistance Equipment used: Rolling walker (2 wheeled) Transfers: Sit to/from Stand Sit to Stand: Min assist         General transfer comment: verbal cues for hand placement  Ambulation/Gait Ambulation/Gait assistance: Min assist Ambulation Distance (Feet): 100 Feet Assistive device: Rolling walker (2 wheeled) Gait Pattern/deviations: Step-through pattern;Decreased stride length;Decreased weight shift to left;Decreased dorsiflexion - left Gait velocity: decreased   General Gait Details: Left foot drop with fatigue, decreased hip and knee flexion with swing phase.   Stairs             Wheelchair Mobility    Modified Rankin (Stroke Patients Only)       Balance Overall balance assessment: Needs assistance Sitting-balance support: No upper extremity supported Sitting balance-Leahy Scale: Good     Standing balance support: Bilateral upper extremity supported Standing balance-Leahy Scale: Fair                      Cognition Arousal/Alertness: Awake/alert Behavior During Therapy: WFL for tasks assessed/performed Overall Cognitive Status: Within Functional Limits for tasks assessed                      Exercises      General Comments        Pertinent Vitals/Pain Pain Assessment: No/denies pain    Home Living                      Prior Function            PT Goals (current goals can now be found in the care plan section) Progress towards PT goals: Progressing toward goals    Frequency  Min 3X/week    PT Plan Current plan remains appropriate    Co-evaluation             End of Session Equipment Utilized During Treatment: Gait belt Activity Tolerance: Patient limited by fatigue Patient left: in bed;with call bell/phone within reach     Time: 1055-1120 PT Time Calculation (min) (ACUTE ONLY): 25 min  Charges:  $Gait Training: 8-22 mins $Therapeutic Activity: 8-22 mins  G Codes:      Darren [name] 08/23/2014, 11:36 AM

## 2014-08-23 NOTE — Progress Notes (Signed)
1615 Dr. Clifton JamesMcAlhany was notified of pt wishes to be DNR. Dr. Clifton JamesMcAlhany came into pt room and went over DNR wishes and signed paper with patient. Pt verbalized understanding and wishes to be DNR.

## 2014-08-23 NOTE — Progress Notes (Signed)
1550 pt calls and states he is having difficulty breathing. When arriving to room I see pt poorly laying in bed, slouched down. After repositioning and placing pt on 1 L [place] O2 for comfort Pt O2 sat is 100 % and pt is stable feeling much more comfortable. Pt is also requesting the Md to come so he can have DNR signed for wishes to be honored.

## 2014-08-23 NOTE — Progress Notes (Signed)
ANTICOAGULATION CONSULT NOTE - Follow Up Consult  Pharmacy Consult for Heparin Indication: chest pain/ACS  No Known Allergies  Patient Measurements: Height: 5\' 7"  (170.2 cm) Weight: 130 lb 11.2 oz (59.285 kg) (scale B) IBW/kg (Calculated) : 66.1 Heparin Dosing Weight: 59 kg  Vital Signs: Temp: 97.9 F (36.6 C) (02/26 0525) Temp Source: Oral (02/26 0525) BP: 82/54 mmHg (02/26 0525) Pulse Rate: 65 (02/26 0525)  Labs:  Recent Labs  08/21/14 0649 08/21/14 0928 08/22/14 0412 08/22/14 0838 08/23/14 0406  HGB 11.7*  --  11.3*  --  10.9*  HCT 34.9*  --  33.4*  --  32.9*  PLT 313  --  285  --  311  HEPARINUNFRC  --  0.53 0.41  --  0.42  CREATININE  --   --   --  1.50* 1.54*    Estimated Creatinine Clearance: 35.8 mL/min (by C-G formula based on Cr of 1.54).   Assessment: 74yo male presents with leg swelling. Trop 0.53, BNP 1548.8, Hgb 14, Plt 320, sCr 1.86.  PMH: HTN, HLD, GERD, CVA, CKD (baseline Cr 1.6), with L3 compression fracture s/p kyphoplasty on 2/10 and history of MR   Anticoagulation: heparin for r/o ACS. Heparin level 0.42 in goal. Baseline Hgb 14>10.9 today. Plts stable.  Cardiovascular: new onset HF likely due to MR and likely NSTEMI w/ trop max of 0.53, BNP 1548 . EF 40-45%. Sever MR contributing to LV dysfunction (needs MVR, pt not currently considering). BP 82/54with HR 59-67. I/O +1889/24h (boluses for low BP). ds: ASA81, Lipitor80, metoprolol (IV Lasix d/c'd)  Neuro: s/p OR for compression fx.  Endocrinology: glucose ok  Nephrology: SCr= 1.54, CrCl~ 35  PTA Medication Issues: none? (was on ACE and statin in the past but pt no longer taking)  Best Practices: hep gtt  Goal of Therapy:  Heparin level 0.3-0.7 units/ml Monitor platelets by anticoagulation protocol: Yes   Plan:  Continue IV heparin at 900 units/hr Daily HL and CBC   Analisia Kingsford S. Merilynn Finlandobertson, PharmD, BCPS Clinical Staff Pharmacist Pager [phone]  Misty Stanleyobertson, Carolan Avedisian  Stillinger 08/23/2014,8:35 AM

## 2014-08-23 NOTE — Progress Notes (Signed)
Patient Name: Darren [name] Date of Encounter: 08/23/2014   Principal Problem:   Acute combined systolic and diastolic CHF, NYHA class 3 Active Problems:   Severe mitral regurgitation   Elevated troponin   Congestive dilated cardiomyopathy   Acute renal failure superimposed on stage 3 chronic kidney disease   Hypokalemia   Normocytic anemia    SUBJECTIVE  No chest pain.  Breathing stable.  Overall feels well.  Has thought about cath/TEE/surgery and says that he'd like to pursue further w/u.  CURRENT MEDS . aspirin EC  81 mg Oral Daily  . atorvastatin  80 mg Oral q1800  . metoprolol tartrate  12.5 mg Oral BID WC  . sodium chloride  3 mL Intravenous Q12H    OBJECTIVE  Filed Vitals:   08/22/14 2043 08/22/14 2240 08/23/14 0220 08/23/14 0525  BP: 84/56 82/52 97/63  82/54  Pulse: 67 66 66 65  Temp: 97.6 F (36.4 C)  97.3 F (36.3 C) 97.9 F (36.6 C)  TempSrc: Oral  Oral Oral  Resp: 16  16 18   Height:      Weight:    130 lb 11.2 oz (59.285 kg)  SpO2: 98%  100% 100%    Intake/Output Summary (Last 24 hours) at 08/23/14 0841 Last data filed at 08/23/14 0600  Gross per 24 hour  Intake   2339 ml  Output    450 ml  Net   1889 ml   Filed Weights   08/21/14 0500 08/22/14 0624 08/23/14 0525  Weight: 125 lb 9.6 oz (56.972 kg) 126 lb 3.2 oz (57.244 kg) 130 lb 11.2 oz (59.285 kg)    PHYSICAL EXAM  General: Pleasant, NAD. Neuro: Alert and oriented X 3. Moves all extremities spontaneously. Psych: Normal affect. HEENT:  Normal  Neck: Supple without bruits or JVD. Lungs:  Resp regular and unlabored, diminished breath sounds bilat. Heart: irreg no s3, s4, 3/6 syst murmur throughout - loudest @ apex and radiating to left axilla and back. Abdomen: Soft, non-tender, non-distended, BS + x 4.  Extremities: No clubbing, cyanosis.  1+ bilat malleolar edema. DP/PT/Radials 2+ and equal bilaterally.  Accessory Clinical Findings  CBC  Recent Labs  08/22/14 0412  08/23/14 0406  WBC 5.6 6.1  HGB 11.3* 10.9*  HCT 33.4* 32.9*  MCV 88.6 89.9  PLT 285 311   Basic Metabolic Panel  Recent Labs  08/22/14 0838 08/23/14 0406  NA 137 134*  K 2.9* 3.1*  CL 102 102  CO2 29 26  GLUCOSE 86 100*  BUN 35* 34*  CREATININE 1.50* 1.54*  CALCIUM 7.9* 7.8*   TELE  Rsr, pvc's, 6 beat run of aivr.  Radiology/Studies  Dg Chest 2 View  2015/02/07   CLINICAL DATA:  Bilateral leg swelling. Back surgery 2 weeks ago. Persistent shortness of breath. Weakness.  EXAM: CHEST  2 VIEW  COMPARISON:  08/07/2014  FINDINGS: Moderate enlargement of the cardiopericardial silhouette. Atherosclerotic calcification of the aortic arch.  Airspace opacity in the lingula and left lower lobe with some associated adjacent pleural effusion. Suspected small right pleural effusion in the posterior costophrenic angle as well.  IMPRESSION: 1. Airspace opacity at the left lung base in excess of the passive atelectasis from the pleural effusion. Appearance could reflect atelectasis or pneumonia. 2. Small left and trace right pleural effusion. 3. Moderate enlargement of the cardiopericardial silhouette.   Electronically Signed   By: Gaylyn RongWalter  Liebkemann M.D.   On: 02016/08/12 21:54   ASSESSMENT AND PLAN  1.  Acute  combined systolic/diastolic CHF:  EF 40-45% by echo this admission.  Lasix held yesterday as he is relatively euvolemic.  Minus 1.8 L for admission.  Wt up from 126 to 130 this AM if accurate (both were on bedside scale according to pt).  He does have malleolar edema but otw looks good. Will likely need PO lasix as we go forward but would like to hold in prep for cath on Monday.  BP's soft.  Hold bb.  No acei/arb/arni/spiro/hydralazine/nitrate in setting of intermittent hypotension.  2.  Severe MR:  Noted on echo.  Needs TEE along with R & L heart cath.  Pt says he discussed with dtrs yesterday and is now willing to proceed.  Will plan for Monday.  Says he is interested in surgery if it is  necessary.    3.  Nstemi/elevated troponin:  In setting of above.  Needs R & L heart cath to further eval.  Cont asa, statin.  On low dose bb however BP's very soft.  Will hold.  4.  Acute on chronic stage III kidney dzs:  Creat 1.54 today - roughly @ baseline.  5.  Normocytic anemia:  H/H drifting down since admission (14.0/41.1 10.9/32.9).  Has been on heparin.  Has not had any stools. Most likely 2/2 repeated blood draws.  Will order FOB testing.  6.  Hypokalemia:  Supp.  7.  Tob Abuse:  Cessation advised.  Signed, Nicolasa Ducking NP  I have personally seen and examined this patient with Ward Givens, NP. I agree with the assessment and plan as outlined above. I have had another long discussion with the patient and his daughter this am. He has severe MR, LV dysfunction likely due to his MR and high probability of CAD. He needs a right and left heart cath and TEE. He is now committed to pursue an invasive evaluation. Will plan right and left heart cath Monday and will try to arrange TEE Monday. Renal function is stable. Lasix on hold. He may not be a candidate for open heart surgery but would have to at least consider this.   MCALHANY,CHRISTOPHER 10:39 AM 08/23/2014

## 2014-08-23 NOTE — Progress Notes (Signed)
Patient BP low this morning (82/54). Post bolus earlier in shift BP was 97/63. Patient remains asymptomatic. MD on call notified. Will continue to monitor. Blood pressure 82/54, pulse 65, temperature 97.9 F (36.6 C), temperature source Oral, resp. rate 18, height 5\' 7"  (1.702 [name]), weight 59.285 kg (130 lb 11.2 oz), SpO2 100 %.  Darren [name], Darren [name]

## 2014-08-23 NOTE — Progress Notes (Signed)
    [facility] has been requested to perform a transesophageal echocardiogram on 2.29 @ 11AM for severe MR with Dr. Elease HashimotoNahser.  After careful review of history and examination, the risks and benefits of transesophageal echocardiogram have been explained including risks of esophageal damage, perforation (1:10,000 risk), bleeding, pharyngeal hematoma as well as other potential complications associated with conscious sedation including aspiration, arrhythmia, respiratory failure and death. Alternatives to treatment were discussed, questions were answered. Patient is willing to proceed.   He is also scheduled for diagnostic catheterization on 2/29 with Dr. [place] @ 13:30. The patient understands that risks include but are not limited to stroke (1 in 1000), death (1 in 1000), kidney failure [usually temporary] (1 in 500), bleeding (1 in 200), allergic reaction [possibly serious] (1 in 200), and agrees to proceed.    Nicolasa Duckinghristopher Aaryan Essman, NP 08/23/2014 10:54 AM

## 2014-08-24 LAB — BASIC METABOLIC PANEL
ANION GAP: 6 (ref 5–15)
BUN: 34 mg/dL — ABNORMAL HIGH (ref 6–23)
CALCIUM: 8.3 mg/dL — AB (ref 8.4–10.5)
CO2: 26 mmol/L (ref 19–32)
CREATININE: 1.49 mg/dL — AB (ref 0.50–1.35)
Chloride: 104 mmol/L (ref 96–112)
GFR calc non Af Amer: 45 mL/min — ABNORMAL LOW (ref >90)
GFR, EST AFRICAN AMERICAN: 52 mL/min — AB (ref >90)
Glucose, Bld: 103 mg/dL — ABNORMAL HIGH (ref 70–99)
POTASSIUM: 4 mmol/L (ref 3.5–5.1)
Sodium: 136 mmol/L (ref 135–145)

## 2014-08-24 LAB — HEPARIN LEVEL (UNFRACTIONATED): Heparin Unfractionated: 0.34 IU/mL (ref 0.30–0.70)

## 2014-08-24 NOTE — Progress Notes (Signed)
ANTICOAGULATION CONSULT NOTE - Follow Up Consult  Pharmacy Consult for Heparin Indication: chest pain/ACS  No Known Allergies  Patient Measurements: Height: 5\' 7"  (170.2 cm) Weight: 130 lb 9.6 oz (59.24 kg) (Scale B) IBW/kg (Calculated) : 66.1 Heparin Dosing Weight: 59 kg  Vital Signs: Temp: 98 F (36.7 C) (02/27 0526) Temp Source: Oral (02/27 0526) BP: 102/81 mmHg (02/27 0526) Pulse Rate: 72 (02/27 0526)  Labs:  Recent Labs  08/22/14 0412 08/22/14 0838 08/23/14 0406 08/24/14 0354  HGB 11.3*  --  10.9*  --   HCT 33.4*  --  32.9*  --   PLT 285  --  311  --   HEPARINUNFRC 0.41  --  0.42 0.34  CREATININE  --  1.50* 1.54* 1.49*    Estimated Creatinine Clearance: 37 mL/min (by C-G formula based on Cr of 1.49).   Assessment: 73yo male presents on 2015-01-18 with leg swelling and ACS who continues on heparin gtt. HL this am remains at goal at 0.34. CBC remains low but relatively stable with no reported significant s/s bleeding.   Goal of Therapy:  Heparin level 0.3-0.7 units/ml Monitor platelets by anticoagulation protocol: Yes   Plan:  Continue IV heparin at 900 units/hr Daily HL and CBC F/u cath on Monday  Reigan Tolliver K. Bonnye FavaNicolsen, PharmD, BCPS Clinical Pharmacist - Resident Pager: [phone] Pharmacy: [phone] 08/24/2014 7:31 AM

## 2014-08-24 NOTE — Progress Notes (Signed)
   SUBJECTIVE: Tired,  Poor appetite  At this time, he denies chest pain, shortness of breath, or any new concerns.  . aspirin EC  81 mg Oral Daily  . atorMarland Kitchenvastatin  80 mg Oral q1800  . potassium chloride  20 mEq Oral Daily  . sodium chloride  3 mL Intravenous Q12H   . [START ON 08/10/2014] sodium chloride    . heparin 900 Units/hr (08/24/14 0014)    OBJECTIVE: Physical Exam: Filed Vitals:   08/23/14 2120 08/24/14 0223 08/24/14 0526 08/24/14 1045  BP: 104/61 100/71 102/81 112/78  Pulse: 72 68 72 73  Temp: 98 F (36.7 C) 98.4 F (36.9 C) 98 F (36.7 C) 97.5 F (36.4 C)  TempSrc: Oral Oral Oral Oral  Resp: 20 20 20    Height:      Weight:   130 lb 9.6 oz (59.24 kg)   SpO2: 100% 98% 97% 100%    Intake/Output Summary (Last 24 hours) at 08/24/14 1250 Last data filed at 08/24/14 1047  Gross per 24 hour  Intake   1147 ml  Output    525 ml  Net    622 ml    Telemetry reveals sinus rhythm with PVCs  GEN- The patient is chronically ill appearing, alert and oriented x 3 today.   Head- normocephalic, atraumatic Eyes-  Sclera clear, conjunctiva pink Ears- hearing intact Oropharynx- clear Neck- supple,  Lungs- prolonged expiratory phase, poor air flow, normal work of breathing Heart- Regular rate and rhythm with ectopy, 3/6 SEM at the apex GI- soft, NT, ND, + BS Extremities- no clubbing, cyanosis, or edema Skin- no rash or lesion Psych- euthymic mood, full affect Neuro- strength and sensation are intact  LABS: Basic Metabolic Panel:  Recent Labs  30/86/5702/26/16 0406 08/24/14 0354  NA 134* 136  K 3.1* 4.0  CL 102 104  CO2 26 26  GLUCOSE 100* 103*  BUN 34* 34*  CREATININE 1.54* 1.49*  CALCIUM 7.8* 8.3*   Liver Function Tests: No results for input(s): AST, ALT, ALKPHOS, BILITOT, PROT, ALBUMIN in the last 72 hours. No results for input(s): LIPASE, AMYLASE in the last 72 hours. CBC:  Recent Labs  08/22/14 0412 08/23/14 0406  WBC 5.6 6.1  HGB 11.3* 10.9*  HCT  33.4* 32.9*  MCV 88.6 89.9  PLT 285 311   ASSESSMENT AND PLAN:    1. Acute combined systolic/diastolic CHF: EF 84-69%40-45% by echo this admission. RHC/ LHC on Monday Would hold lasix and follow creatinine until then No acei/arb/arni/spiro/hydralazine/nitrate in setting of intermittent hypotension.  2. Severe MR: Noted on echo. Needs TEE along with R & L heart cath.will plan for Monday   3. Nstemi/elevated troponin: In setting of above. Needs R & L heart cath to further eval. Cont asa, statin. continue heparin gtt for now  4. Acute on chronic stage III kidney dzs: improved and near baseline  5. Normocytic anemia:  Stable No change required today  6. Tob Abuse: Cessation advised.  Hillis RangeJames Ishita Mcnerney, MD 08/24/2014 12:50 PM

## 2014-08-25 LAB — HEPARIN LEVEL (UNFRACTIONATED)
Heparin Unfractionated: 0.22 IU/mL — ABNORMAL LOW (ref 0.30–0.70)
Heparin Unfractionated: 0.43 IU/mL (ref 0.30–0.70)

## 2014-08-25 LAB — CBC
HCT: 36 % — ABNORMAL LOW (ref 39.0–52.0)
HEMOGLOBIN: 12.2 g/dL — AB (ref 13.0–17.0)
MCH: 31.3 pg (ref 26.0–34.0)
MCHC: 33.9 g/dL (ref 30.0–36.0)
MCV: 92.3 fL (ref 78.0–100.0)
PLATELETS: 289 10*3/uL (ref 150–400)
RBC: 3.9 MIL/uL — AB (ref 4.22–5.81)
RDW: 15.4 % (ref 11.5–15.5)
WBC: 7.9 10*3/uL (ref 4.0–10.5)

## 2014-08-25 MED ORDER — ASPIRIN EC 81 MG PO TBEC
81.0000 mg | DELAYED_RELEASE_TABLET | Freq: Every day | ORAL | Status: DC
  Administered 2014-08-27 – 2014-08-28 (×2): 81 mg via ORAL
  Filled 2014-08-25 (×3): qty 1

## 2014-08-25 MED ORDER — SODIUM CHLORIDE 0.9 % IJ SOLN
3.0000 mL | INTRAMUSCULAR | Status: DC | PRN
Start: 2014-08-25 — End: 2014-08-26

## 2014-08-25 MED ORDER — SODIUM CHLORIDE 0.9 % IV SOLN
1.0000 mL/kg/h | INTRAVENOUS | Status: DC
  Administered 2014-08-26: 1 mL/kg/h via INTRAVENOUS

## 2014-08-25 MED ORDER — HEPARIN (PORCINE) IN NACL 100-0.45 UNIT/ML-% IJ SOLN
1050.0000 [IU]/h | INTRAMUSCULAR | Status: DC
  Administered 2014-08-25 – 2014-08-26 (×2): 1050 [IU]/h via INTRAVENOUS
  Filled 2014-08-25 (×3): qty 250

## 2014-08-25 MED ORDER — SODIUM CHLORIDE 0.9 % IV SOLN: 250.0000 mL | INTRAVENOUS | Status: DC | PRN

## 2014-08-25 MED ORDER — SODIUM CHLORIDE 0.9 % IJ SOLN: 3.0000 mL | INTRAMUSCULAR | Status: DC | PRN

## 2014-08-25 MED ORDER — ASPIRIN 81 MG PO CHEW
81.0000 mg | CHEWABLE_TABLET | ORAL | Status: AC
  Administered 2014-08-26: 81 mg via ORAL
  Filled 2014-08-25: qty 1

## 2014-08-25 MED ORDER — SODIUM CHLORIDE 0.9 % IJ SOLN
3.0000 mL | Freq: Two times a day (BID) | INTRAMUSCULAR | Status: DC
Start: 2014-08-25 — End: 2014-08-26

## 2014-08-25 MED ORDER — SODIUM CHLORIDE 0.9 % IJ SOLN: 3.0000 mL | Freq: Two times a day (BID) | INTRAMUSCULAR | Status: DC

## 2014-08-25 MED ORDER — SODIUM CHLORIDE 0.9 % IV SOLN: INTRAVENOUS | Status: DC

## 2014-08-25 NOTE — Progress Notes (Signed)
ANTICOAGULATION CONSULT NOTE - Follow Up Consult  Pharmacy Consult for Heparin Indication: chest pain/ACS  No Known Allergies  Patient Measurements: Height: 5\' 7"  (170.2 cm) Weight: 130 lb 9.6 oz (59.24 kg) (Scale B) IBW/kg (Calculated) : 66.1 Heparin Dosing Weight: 59 kg  Vital Signs: Temp: 97.7 F (36.5 C) (02/27 2100) Temp Source: Oral (02/27 2100) BP: 128/88 mmHg (02/27 2100) Pulse Rate: 82 (02/27 2100)  Labs:  Recent Labs  08/22/14 0838 08/23/14 0406 08/24/14 0354 08/25/14 0430  HGB  --  10.9*  --  12.2*  HCT  --  32.9*  --  36.0*  PLT  --  311  --  289  HEPARINUNFRC  --  0.42 0.34 0.22*  CREATININE 1.50* 1.54* 1.49*  --     Estimated Creatinine Clearance: 37 mL/min (by C-G formula based on Cr of 1.49).   Assessment: Heparin level this morning decreased, = 0.22 on 900 units/hr IV heparin for ACS in this 73yo male. . H/H remains low but Hgb increased slightly and pltc stable at 289. Heparin levels previously have been therapeutic on same heparin drip rate. No problems with IV line/heparin infusion per RN's report.  No reported significant s/s bleeding.     Goal of Therapy:  Heparin level 0.3-0.7 units/ml Monitor platelets by anticoagulation protocol: Yes   Plan:  Increase IV heparin to 1050 units/hr Check 6h HL Daily HL and CBC F/u cath on Monday  Noah Delaineuth Debria Broecker, RPh Clinical Pharmacist Pager: [phone] 08/25/2014 5:35 AM

## 2014-08-25 NOTE — Progress Notes (Signed)
Patient daughter called about blood pressure readings.  She told me they were supposed to be done at 8:00 pm however our protocol is to be done every 8 hours after the patient has been admitted for more than 48 hours, which would be 10:00pm.  Patient blood pressure and vital signs have been stable.  Let daughter know about protocol for vital sign checks q8h and she felt i was being argumentative but I wanted her to be aware of our protocol.  I asked if she wanted her fathers vitals to be checked q4h and she said yes that she expects this.  We will be doing q4h vital sign checks from now on.  I told her if she had any concerns to speak with our director and she could clear things up for her. Will continue to monitor Darren [name].

## 2014-08-25 NOTE — Progress Notes (Signed)
ANTICOAGULATION CONSULT NOTE - Follow Up Consult  Pharmacy Consult for Heparin Indication: chest pain/ACS  No Known Allergies  Patient Measurements: Height: 5\' 7"  (170.2 cm) Weight: 129 lb 15 oz (58.939 kg) (Scale B) IBW/kg (Calculated) : 66.1 Heparin Dosing Weight: 59 kg  Vital Signs: Temp: 97.6 F (36.4 C) (02/28 0625) Temp Source: Oral (02/28 0625) BP: 129/81 mmHg (02/28 1047) Pulse Rate: 77 (02/28 1047)  Labs:  Recent Labs  08/23/14 0406 08/24/14 0354 08/25/14 0430 08/25/14 1205  HGB 10.9*  --  12.2*  --   HCT 32.9*  --  36.0*  --   PLT 311  --  289  --   HEPARINUNFRC 0.42 0.34 0.22* 0.43  CREATININE 1.54* 1.49*  --   --     Estimated Creatinine Clearance: 36.8 mL/min (by C-G formula based on Cr of 1.49).   Assessment: 73yo male presents on Aug 19, 2014 with leg swelling and ACS who continues on heparin gtt. HL now at goal at 0.43 after rate increase. CBC remains low but improved with no reported significant s/s bleeding.   Goal of Therapy:  Heparin level 0.3-0.7 units/ml Monitor platelets by anticoagulation protocol: Yes   Plan:  Continue IV heparin at 1050 units/hr Daily HL and CBC F/u cath on Monday  Crisol Muecke K. Bonnye FavaNicolsen, PharmD, BCPS Clinical Pharmacist - Resident Pager: [phone] Pharmacy: [phone] 08/25/2014 1:27 PM

## 2014-08-25 NOTE — Progress Notes (Signed)
   SUBJECTIVE: Tired,. More SOB today.  At this time, he denies chest pain, shortness of breath, or any new concerns.  . aspirin EC  81 mg Oral Daily  . atorvastatin  80 mg Oral q1800  . potassium chloride  20 mEq Oral Daily  . sodium chloride  3 mL Intravenous Q12H   . [START ON 08/08/2014] sodium chloride    . heparin 1,050 Units/hr (08/25/14 0609)    OBJECTIVE: Physical Exam: Filed Vitals:   08/24/14 1434 08/24/14 2100 08/25/14 0625 08/25/14 1047  BP: 90/64 128/88 108/79 129/81  Pulse: 74 82 80 77  Temp: 97.5 F (36.4 C) 97.7 F (36.5 C) 97.6 F (36.4 C)   TempSrc: Oral Oral Oral   Resp: 22 18 18   Height:      Weight:   129 lb 15 oz (58.939 kg)   SpO2: 98% 100% 100%     Intake/Output Summary (Last 24 hours) at 08/25/14 1154 Last data filed at 08/25/14 1048  Gross per 24 hour  Intake 888.95 ml  Output    600 ml  Net 288.95 ml    Telemetry reveals sinus rhythm with PVCs  GEN- The patient is chronically ill appearing, alert and oriented x 3 today.   Head- normocephalic, atraumatic Eyes-  Sclera clear, conjunctiva pink Ears- hearing intact Oropharynx- clear Neck- supple,  Lungs- prolonged expiratory phase, poor air flow, increased WOB Heart- Regular rate and rhythm with ectopy, 4/6 SEM at the apex with palpable thrill GI- soft, NT, ND, + BS Extremities- no clubbing, cyanosis, or edema Skin- no rash or lesion Psych- euthymic mood, full affect Neuro- strength and sensation are intact  LABS: Basic Metabolic Panel:  Recent Labs  08/23/14 0406 08/24/14 0354  NA 134* 136  K 3.1* 4.0  CL 102 104  CO2 26 26  GLUCOSE 100* 103*  BUN 34* 34*  CREATININE 1.54* 1.49*  CALCIUM 7.8* 8.3*   Liver Function Tests: No results for input(s): AST, ALT, ALKPHOS, BILITOT, PROT, ALBUMIN in the last 72 hours. No results for input(s): LIPASE, AMYLASE in the last 72 hours. CBC:  Recent Labs  08/23/14 0406 08/25/14 0430  WBC 6.1 7.9  HGB 10.9* 12.2*  HCT 32.9* 36.0*   MCV 89.9 92.3  PLT 311 289   ASSESSMENT AND PLAN:    1. Acute combined systolic/diastolic CHF: EF 40-45% by echo this admission. RHC/ LHC on Monday Discussed the cath with the patient. The patient understands that risks included but are not limited to stroke (1 in 1000), death (1 in 1000), kidney failure [usually temporary] (1 in 500), bleeding (1 in 200), allergic reaction [possibly serious] (1 in 200). The patient understands and agrees to proceed.   Would hold lasix and follow creatinine until then Gentle hydration starting in am No acei/arb/arni/spiro/hydralazine/nitrate in setting of intermittent hypotension.  2. Severe MR: Noted on echo. Needs TEE along with R & L heart cath.will plan for Monday. Consent order placed.  3. Nstemi/elevated troponin: In setting of above. Needs R & L heart cath to further eval. Cont asa, statin. continue heparin gtt for now  4. Acute on chronic stage III kidney dzs: improved and near baseline  5. Normocytic anemia:  Stable No change required today  6. Tob Abuse: Cessation advised.  Adlyn Fife, MD 08/25/2014 11:54 AM  

## 2014-08-26 ENCOUNTER — Encounter (HOSPITAL_COMMUNITY)
Admission: EM | Disposition: E | Payer: Self-pay | Source: Home / Self Care | Attending: Thoracic Surgery (Cardiothoracic Vascular Surgery)

## 2014-08-26 ENCOUNTER — Other Ambulatory Visit: Payer: Self-pay | Admitting: *Deleted

## 2014-08-26 ENCOUNTER — Encounter (HOSPITAL_COMMUNITY): Payer: Self-pay | Admitting: *Deleted

## 2014-08-26 DIAGNOSIS — I340 Nonrheumatic mitral (valve) insufficiency: Secondary | ICD-10-CM

## 2014-08-26 DIAGNOSIS — I270 Primary pulmonary hypertension: Secondary | ICD-10-CM

## 2014-08-26 DIAGNOSIS — I2510 Atherosclerotic heart disease of native coronary artery without angina pectoris: Secondary | ICD-10-CM

## 2014-08-26 HISTORY — PX: LEFT AND RIGHT HEART CATHETERIZATION WITH CORONARY ANGIOGRAM: SHX5449

## 2014-08-26 HISTORY — PX: TEE WITHOUT CARDIOVERSION: SHX5443

## 2014-08-26 LAB — POCT I-STAT 3, VENOUS BLOOD GAS (G3P V)
ACID-BASE DEFICIT: 5 mmol/L — AB (ref 0.0–2.0)
ACID-BASE DEFICIT: 5 mmol/L — AB (ref 0.0–2.0)
Acid-base deficit: 4 mmol/L — ABNORMAL HIGH (ref 0.0–2.0)
BICARBONATE: 19.8 meq/L — AB (ref 20.0–24.0)
Bicarbonate: 19.3 mEq/L — ABNORMAL LOW (ref 20.0–24.0)
Bicarbonate: 19.7 mEq/L — ABNORMAL LOW (ref 20.0–24.0)
O2 SAT: 82 %
O2 Saturation: 80 %
O2 Saturation: 80 %
PCO2 VEN: 31.9 mmHg — AB (ref 45.0–50.0)
PCO2 VEN: 32.7 mmHg — AB (ref 45.0–50.0)
PH VEN: 7.39 — AB (ref 7.250–7.300)
PH VEN: 7.402 — AB (ref 7.250–7.300)
TCO2: 20 mmol/L (ref 0–100)
TCO2: 21 mmol/L (ref 0–100)
TCO2: 21 mmol/L (ref 0–100)
pCO2, Ven: 31.9 mmHg — ABNORMAL LOW (ref 45.0–50.0)
pH, Ven: 7.387 — ABNORMAL HIGH (ref 7.250–7.300)
pO2, Ven: 43 mmHg (ref 30.0–45.0)
pO2, Ven: 44 mmHg (ref 30.0–45.0)
pO2, Ven: 46 mmHg — ABNORMAL HIGH (ref 30.0–45.0)

## 2014-08-26 LAB — BASIC METABOLIC PANEL
ANION GAP: 5 (ref 5–15)
BUN: 35 mg/dL — ABNORMAL HIGH (ref 6–23)
CALCIUM: 8.2 mg/dL — AB (ref 8.4–10.5)
CO2: 22 mmol/L (ref 19–32)
Chloride: 105 mmol/L (ref 96–112)
Creatinine, Ser: 1.42 mg/dL — ABNORMAL HIGH (ref 0.50–1.35)
GFR calc Af Amer: 55 mL/min — ABNORMAL LOW (ref >90)
GFR calc non Af Amer: 47 mL/min — ABNORMAL LOW (ref >90)
GLUCOSE: 113 mg/dL — AB (ref 70–99)
Potassium: 4.6 mmol/L (ref 3.5–5.1)
Sodium: 132 mmol/L — ABNORMAL LOW (ref 135–145)

## 2014-08-26 LAB — CBC
HCT: 36.3 % — ABNORMAL LOW (ref 39.0–52.0)
Hemoglobin: 12.1 g/dL — ABNORMAL LOW (ref 13.0–17.0)
MCH: 30.7 pg (ref 26.0–34.0)
MCHC: 33.3 g/dL (ref 30.0–36.0)
MCV: 92.1 fL (ref 78.0–100.0)
PLATELETS: 323 10*3/uL (ref 150–400)
RBC: 3.94 MIL/uL — ABNORMAL LOW (ref 4.22–5.81)
RDW: 15.5 % (ref 11.5–15.5)
WBC: 6.5 10*3/uL (ref 4.0–10.5)

## 2014-08-26 LAB — PROTIME-INR
INR: 1.16 (ref 0.00–1.49)
PROTHROMBIN TIME: 14.9 s (ref 11.6–15.2)

## 2014-08-26 LAB — POCT I-STAT 3, ART BLOOD GAS (G3+)
Acid-base deficit: 4 mmol/L — ABNORMAL HIGH (ref 0.0–2.0)
Bicarbonate: 19.2 mEq/L — ABNORMAL LOW (ref 20.0–24.0)
O2 Saturation: 99 %
PCO2 ART: 27.6 mmHg — AB (ref 35.0–45.0)
TCO2: 20 mmol/L (ref 0–100)
pH, Arterial: 7.451 — ABNORMAL HIGH (ref 7.350–7.450)
pO2, Arterial: 111 mmHg — ABNORMAL HIGH (ref 80.0–100.0)

## 2014-08-26 LAB — POCT ACTIVATED CLOTTING TIME: Activated Clotting Time: 91 seconds

## 2014-08-26 LAB — HEPARIN LEVEL (UNFRACTIONATED): Heparin Unfractionated: 0.51 IU/mL (ref 0.30–0.70)

## 2014-08-26 SURGERY — ECHOCARDIOGRAM, TRANSESOPHAGEAL
Anesthesia: Moderate Sedation

## 2014-08-26 SURGERY — LEFT AND RIGHT HEART CATHETERIZATION WITH CORONARY ANGIOGRAM
Anesthesia: LOCAL

## 2014-08-26 MED ORDER — HEPARIN (PORCINE) IN NACL 100-0.45 UNIT/ML-% IJ SOLN
1250.0000 [IU]/h | INTRAMUSCULAR | Status: DC
  Administered 2014-08-27 (×2): 1050 [IU]/h via INTRAVENOUS
  Filled 2014-08-26 (×4): qty 250

## 2014-08-26 MED ORDER — FENTANYL CITRATE 0.05 MG/ML IJ SOLN
INTRAMUSCULAR | Status: AC
  Filled 2014-08-26: qty 2

## 2014-08-26 MED ORDER — MIDAZOLAM HCL 5 MG/ML IJ SOLN
INTRAMUSCULAR | Status: AC
  Filled 2014-08-26: qty 2

## 2014-08-26 MED ORDER — HEPARIN (PORCINE) IN NACL 2-0.9 UNIT/ML-% IJ SOLN
INTRAMUSCULAR | Status: AC
Start: 2014-08-26 — End: 2014-08-26
  Filled 2014-08-26: qty 1000

## 2014-08-26 MED ORDER — BUTAMBEN-TETRACAINE-BENZOCAINE 2-2-14 % EX AERO
INHALATION_SPRAY | CUTANEOUS | Status: DC | PRN
  Administered 2014-08-26: 2 via TOPICAL

## 2014-08-26 MED ORDER — LIDOCAINE HCL (PF) 1 % IJ SOLN
INTRAMUSCULAR | Status: AC
  Filled 2014-08-26: qty 30

## 2014-08-26 MED ORDER — SODIUM CHLORIDE 0.9 % IV SOLN
INTRAVENOUS | Status: AC
  Administered 2014-08-26: 16:00:00 via INTRAVENOUS

## 2014-08-26 MED ORDER — DIPHENHYDRAMINE HCL 50 MG/ML IJ SOLN
INTRAMUSCULAR | Status: AC
  Filled 2014-08-26: qty 1

## 2014-08-26 MED ORDER — MIDAZOLAM HCL 10 MG/2ML IJ SOLN
INTRAMUSCULAR | Status: DC | PRN
  Administered 2014-08-26: 1 mg via INTRAVENOUS
  Administered 2014-08-26: 2 mg via INTRAVENOUS

## 2014-08-26 MED ORDER — FENTANYL CITRATE 0.05 MG/ML IJ SOLN
INTRAMUSCULAR | Status: DC | PRN
  Administered 2014-08-26: 25 ug via INTRAVENOUS

## 2014-08-26 NOTE — Interval H&P Note (Signed)
History and Physical Interval Note:  08/15/2014 10:33 AM  Darren [name]  has presented today for surgery, with the diagnosis of AORTIC STENOSIS   The various methods of treatment have been discussed with the patient and family. After consideration of risks, benefits and other options for treatment, the patient has consented to  Procedure(s): TRANSESOPHAGEAL ECHOCARDIOGRAM (TEE) (N/A) as a surgical intervention .  The patient's history has been reviewed, patient examined, no change in status, stable for surgery.  I have reviewed the patient's chart and labs.  Questions were answered to the patient's satisfaction.     Nahser, Deloris PingPhilip J

## 2014-08-26 NOTE — Progress Notes (Signed)
 SUBJECTIVE:  Very sleepy post TEE  OBJECTIVE:   Vitals:   Filed Vitals:   08/15/2014 1115 08/11/2014 1124 08/14/2014 1130 08/05/2014 1136  BP: 106/83 115/78 114/84 114/84  Pulse: 74 72 73   Temp:      TempSrc:      Resp: 20 17 22 22  Height:      Weight:      SpO2: 96% 99% 100% 98%   I&O's:   Intake/Output Summary (Last 24 hours) at 08/24/2014 1212 Last data filed at 08/18/2014 0901  Gross per 24 hour  Intake 726.46 ml  Output    450 ml  Net 276.46 ml   TELEMETRY: Reviewed telemetry pt in NSR with PVC's:     PHYSICAL EXAM General: Well developed, well nourished, in no acute distress Head: Eyes PERRLA, No xanthomas.   Normal cephalic and atramatic  Lungs:   Clear bilaterally to auscultation anteriorly Heart:   HRRR S1 S2 Pulses are 2+ & equal. Abdomen: Bowel sounds are positive, abdomen soft and non-tender without masses  Extremities:  1+ edema DP +1 Neuro: sleepy    LABS: Basic Metabolic Panel:  Recent Labs  08/24/14 0354 08/24/2014 0358  NA 136 132*  K 4.0 4.6  CL 104 105  CO2 26 22  GLUCOSE 103* 113*  BUN 34* 35*  CREATININE 1.49* 1.42*  CALCIUM 8.3* 8.2*   Liver Function Tests: No results for input(s): AST, ALT, ALKPHOS, BILITOT, PROT, ALBUMIN in the last 72 hours. No results for input(s): LIPASE, AMYLASE in the last 72 hours. CBC:  Recent Labs  08/25/14 0430 08/18/2014 0358  WBC 7.9 6.5  HGB 12.2* 12.1*  HCT 36.0* 36.3*  MCV 92.3 92.1  PLT 289 323   Cardiac Enzymes: No results for input(s): CKTOTAL, CKMB, CKMBINDEX, TROPONINI in the last 72 hours. BNP: Invalid input(s): POCBNP D-Dimer: No results for input(s): DDIMER in the last 72 hours. Hemoglobin A1C: No results for input(s): HGBA1C in the last 72 hours. Fasting Lipid Panel: No results for input(s): CHOL, HDL, LDLCALC, TRIG, CHOLHDL, LDLDIRECT in the last 72 hours. Thyroid Function Tests: No results for input(s): TSH, T4TOTAL, T3FREE, THYROIDAB in the last 72 hours.  Invalid input(s):  FREET3 Anemia Panel: No results for input(s): VITAMINB12, FOLATE, FERRITIN, TIBC, IRON, RETICCTPCT in the last 72 hours. Coag Panel:   Lab Results  Component Value Date   INR 1.16 08/06/2014   INR 1.13 08/07/2014    RADIOLOGY: Dg Chest 2 View  08/05/2014   CLINICAL DATA:  Bilateral leg swelling. Back surgery 2 weeks ago. Persistent shortness of breath. Weakness.  EXAM: CHEST  2 VIEW  COMPARISON:  08/07/2014  FINDINGS: Moderate enlargement of the cardiopericardial silhouette. Atherosclerotic calcification of the aortic arch.  Airspace opacity in the lingula and left lower lobe with some associated adjacent pleural effusion. Suspected small right pleural effusion in the posterior costophrenic angle as well.  IMPRESSION: 1. Airspace opacity at the left lung base in excess of the passive atelectasis from the pleural effusion. Appearance could reflect atelectasis or pneumonia. 2. Small left and trace right pleural effusion. 3. Moderate enlargement of the cardiopericardial silhouette.   Electronically Signed   By: Walter  Liebkemann M.D.   On: 08/04/2014 21:54   Dg Chest 2 View  08/07/2014   CLINICAL DATA:  Preop for lumbar surgery.  EXAM: CHEST  2 VIEW  COMPARISON:  08/11/2007  FINDINGS: Small bilateral pleural effusions. Borderline cardiomegaly. There is moderate aortic tortuosity which is stable from prior. Stable calcification   overlapping the heart, valvular versus coronary. Mild atelectasis in the lower lungs. No convincing pneumonia or edema.  IMPRESSION: Small bilateral pleural effusion and mild basilar atelectasis.   Electronically Signed   By: Jonathon  Watts M.D.   On: 08/07/2014 10:44   Dg Lumbar Spine 2-3 Views  08/07/2014   CLINICAL DATA:  L3 kyphoplasty  EXAM: DG C-ARM 61-120 MIN; LUMBAR SPINE - 2-3 VIEW  TECHNIQUE: AP and lateral fluoroscopic spot views  FLUOROSCOPY TIME:  Radiation Exposure Index (as provided by the fluoroscopic device):  If the device does not provide the exposure index:   Fluoroscopy Time (in minutes and seconds):  2 minutes and 16 seconds  Number of Acquired Images:  2  COMPARISON:  07/17/2014  FINDINGS: Mild compression deformity at L3 status post kyphoplasty. Cement projects over the vertebral body.  IMPRESSION: Anticipated appearance status post kyphoplasty.   Electronically Signed   By: Raymond  Rubner M.D.   On: 08/07/2014 16:00   Dg C-arm 1-60 Min  08/07/2014   CLINICAL DATA:  L3 kyphoplasty  EXAM: DG C-ARM 61-120 MIN; LUMBAR SPINE - 2-3 VIEW  TECHNIQUE: AP and lateral fluoroscopic spot views  FLUOROSCOPY TIME:  Radiation Exposure Index (as provided by the fluoroscopic device):  If the device does not provide the exposure index:  Fluoroscopy Time (in minutes and seconds):  2 minutes and 16 seconds  Number of Acquired Images:  2  COMPARISON:  07/17/2014  FINDINGS: Mild compression deformity at L3 status post kyphoplasty. Cement projects over the vertebral body.  IMPRESSION: Anticipated appearance status post kyphoplasty.   Electronically Signed   By: Raymond  Rubner M.D.   On: 08/07/2014 16:00   ASSESSMENT AND PLAN:    1. Acute combined systolic/diastolic CHF: EF 40-45% by echo this admission. RHC/ LHC planned for today.  Lasix on hold for cath.   No acei/arb/arni/spiro/hydralazine/nitrate in setting of intermittent hypotension.  2. Severe MR: Noted on echo. 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  Will need repair pending results of cath.    3. Nstemi/elevated troponin: In setting of above. R & L heart cath today to further eval. Cont asa, high dose statin. Continue heparin gtt for now  4. Acute on chronic stage III kidney dzs: improved and near baseline  5. Normocytic anemia   TURNER,TRACI R, MD  08/13/2014  12:12 PM  

## 2014-08-26 NOTE — Progress Notes (Signed)
Utilization review complete. Gesenia Bantz RN CCM Case Mgmt phone [phone] 

## 2014-08-26 NOTE — Progress Notes (Signed)
Pt listed as FULL code in chart, last order for DNR d/ced in chart. Pt requesting to be DNR in chart, NP on call notified. Will continue to monitor. Huel Coventryosenberger, Halee Glynn A, RN

## 2014-08-26 NOTE — Progress Notes (Signed)
ANTICOAGULATION CONSULT NOTE - Follow Up Consult  Pharmacy Consult for Heparin Indication: severe 3v CAD  No Known Allergies  Patient Measurements: Height: 5\' 7"  (170.2 cm) Weight: 128 lb 15.5 oz (58.5 kg) IBW/kg (Calculated) : 66.1  Vital Signs: Temp: 96.6 F (35.9 C) (02/29 0933) Temp Source: Axillary (02/29 0933) BP: 125/87 mmHg (02/29 1600) Pulse Rate: 83 (02/29 1600)  Labs:  Recent Labs  08/24/14 0354 08/25/14 0430 08/25/14 1205 08/21/2014 0358  HGB  --  12.2*  --  12.1*  HCT  --  36.0*  --  36.3*  PLT  --  289  --  323  LABPROT  --   --   --  14.9  INR  --   --   --  1.16  HEPARINUNFRC 0.34 0.22* 0.43 0.51  CREATININE 1.49*  --   --  1.42*    Estimated Creatinine Clearance: 38.3 mL/min (by C-G formula based on Cr of 1.42).  Assessment: 73yom s/p cath found to have severe 3v obstructive CAD and severe MR. Heparin to resume 8 hours post-sheath pull, sheath pulled ~ 1600. He was previsouly therapeutic on 1050 units/hr so will start back at this rate. He is awaiting CVTS consult for CABG/MVR.   Goal of Therapy:  Heparin level 0.3-0.7 units/ml Monitor platelets by anticoagulation protocol: Yes   Plan:  1) At 0000, resume heparin at 1050 units/hr with no bolus 2) Check 8 hour heparin level 3) Follow up CVTS consult  Fredrik RiggerMarkle, Massimo Hartland Sue 08/03/2014,4:27 PM

## 2014-08-26 NOTE — H&P (View-Only) (Signed)
   SUBJECTIVE: Tired,. More SOB today.  At this time, he denies chest pain, shortness of breath, or any new concerns.  Marland Kitchen. aspirin EC  81 mg Oral Daily  . atorvastatin  80 mg Oral q1800  . potassium chloride  20 mEq Oral Daily  . sodium chloride  3 mL Intravenous Q12H   . [START ON 08/20/2014] sodium chloride    . heparin 1,050 Units/hr (08/25/14 0609)    OBJECTIVE: Physical Exam: Filed Vitals:   08/24/14 1434 08/24/14 2100 08/25/14 0625 08/25/14 1047  BP: 90/64 128/88 108/79 129/81  Pulse: 74 82 80 77  Temp: 97.5 F (36.4 C) 97.7 F (36.5 C) 97.6 F (36.4 C)   TempSrc: Oral Oral Oral   Resp: 22 18 18    Height:      Weight:   129 lb 15 oz (58.939 kg)   SpO2: 98% 100% 100%     Intake/Output Summary (Last 24 hours) at 08/25/14 1154 Last data filed at 08/25/14 1048  Gross per 24 hour  Intake 888.95 ml  Output    600 ml  Net 288.95 ml    Telemetry reveals sinus rhythm with PVCs  GEN- The patient is chronically ill appearing, alert and oriented x 3 today.   Head- normocephalic, atraumatic Eyes-  Sclera clear, conjunctiva pink Ears- hearing intact Oropharynx- clear Neck- supple,  Lungs- prolonged expiratory phase, poor air flow, increased WOB Heart- Regular rate and rhythm with ectopy, 4/6 SEM at the apex with palpable thrill GI- soft, NT, ND, + BS Extremities- no clubbing, cyanosis, or edema Skin- no rash or lesion Psych- euthymic mood, full affect Neuro- strength and sensation are intact  LABS: Basic Metabolic Panel:  Recent Labs  81/19/1402/26/16 0406 08/24/14 0354  NA 134* 136  K 3.1* 4.0  CL 102 104  CO2 26 26  GLUCOSE 100* 103*  BUN 34* 34*  CREATININE 1.54* 1.49*  CALCIUM 7.8* 8.3*   Liver Function Tests: No results for input(s): AST, ALT, ALKPHOS, BILITOT, PROT, ALBUMIN in the last 72 hours. No results for input(s): LIPASE, AMYLASE in the last 72 hours. CBC:  Recent Labs  08/23/14 0406 08/25/14 0430  WBC 6.1 7.9  HGB 10.9* 12.2*  HCT 32.9* 36.0*   MCV 89.9 92.3  PLT 311 289   ASSESSMENT AND PLAN:    1. Acute combined systolic/diastolic CHF: EF 78-29%40-45% by echo this admission. RHC/ LHC on Monday Discussed the cath with the patient. The patient understands that risks included but are not limited to stroke (1 in 1000), death (1 in 1000), kidney failure [usually temporary] (1 in 500), bleeding (1 in 200), allergic reaction [possibly serious] (1 in 200). The patient understands and agrees to proceed.   Would hold lasix and follow creatinine until then Gentle hydration starting in am No acei/arb/arni/spiro/hydralazine/nitrate in setting of intermittent hypotension.  2. Severe MR: Noted on echo. Needs TEE along with R & L heart cath.will plan for Monday. Consent order placed.  3. Nstemi/elevated troponin: In setting of above. Needs R & L heart cath to further eval. Cont asa, statin. continue heparin gtt for now  4. Acute on chronic stage III kidney dzs: improved and near baseline  5. Normocytic anemia:  Stable No change required today  6. Tob Abuse: Cessation advised.  Hillis RangeJames Khang Hannum, MD 08/25/2014 11:54 AM

## 2014-08-26 NOTE — CV Procedure (Signed)
    Cardiac Catheterization Procedure Note  Name: Darren HarrisonRichard M Roarty MRN: [id number] DOB: 02/08/1941  Procedure: Right Heart Cath, Left Heart Cath, Selective Coronary Angiography  Indication: 74 yo WM with CHF, severe MR due to MV prolapse and CAD   Procedural Details: The right groin was prepped, draped, and anesthetized with 1% lidocaine. Using the modified Seldinger technique a 5 Fr slender sheath was placed in the right femoral artery and a 7 French sheath was placed in the right femoral vein. A Swan-Ganz catheter was used for the right heart catheterization. Standard protocol was followed for recording of right heart pressures and sampling of oxygen saturations. Fick cardiac output was calculated. Standard Judkins catheters were used for selective coronary angiography and left ventriculography. There were no immediate procedural complications. The patient was transferred to the post catheterization recovery area for further monitoring. Contrast 60 cc omnipaque.   Procedural Findings: Hemodynamics RA 16/17 mean 15 mm Hg RV 54/10/16 mm Hg PA 58/24 mean 36 mm Hg PCWP 32/51 mean 32 mm Hg, very large V waves LV 134/23/37 mm Hg AO 134/76 mean 101 mm Hg  Oxygen saturations: RA 82% RV 80% PA 80% AO 99%  Cardiac Output (Fick) 8.0 L/min  Cardiac Index (Fick) 4.76 L/min/meter squared.   Coronary angiography: Coronary dominance: right  Left mainstem: Normal  Left anterior descending (LAD): 80-90% proximal. Diffuse disease in the proximal vessel.   Left circumflex (LCx): 80% proximal.  Right coronary artery (RCA): Diffuse disease in the proximal vessel up to 70%. The mid vessel is 100% occluded after the RV marginal branch.   Left ventriculography: Not done  Final Conclusions:   1. Severe 3 vessel obstructive CAD 2. Elevated LV filling pressures with large V waves and high cardiac output consistent with severe MR. 3. Moderate pulmonary HTN  Recommendations: Consider  CABG/MVR   [name], MDFACC 2015-03-29, 3:17 PM

## 2014-08-26 NOTE — H&P (View-Only) (Signed)
SUBJECTIVE:  Very sleepy post TEE  OBJECTIVE:   Vitals:   Filed Vitals:   06-Dec-2014 1115 06-Dec-2014 1124 06-Dec-2014 1130 06-Dec-2014 1136  BP: 106/83 115/78 114/84 114/84  Pulse: 74 72 73   Temp:      TempSrc:      Resp: 20 17 22 22   Height:      Weight:      SpO2: 96% 99% 100% 98%   I&O's:   Intake/Output Summary (Last 24 hours) at 06-Dec-2014 1212 Last data filed at 06-Dec-2014 0901  Gross per 24 hour  Intake 726.46 ml  Output    450 ml  Net 276.46 ml   TELEMETRY: Reviewed telemetry pt in NSR with PVC's:     PHYSICAL EXAM General: Well developed, well nourished, in no acute distress Head: Eyes PERRLA, No xanthomas.   Normal cephalic and atramatic  Lungs:   Clear bilaterally to auscultation anteriorly Heart:   HRRR S1 S2 Pulses are 2+ & equal. Abdomen: Bowel sounds are positive, abdomen soft and non-tender without masses  Extremities:  1+ edema DP +1 Neuro: sleepy    LABS: Basic Metabolic Panel:  Recent Labs  16/03/9601/27/16 0354 06-Dec-2014 0358  NA 136 132*  K 4.0 4.6  CL 104 105  CO2 26 22  GLUCOSE 103* 113*  BUN 34* 35*  CREATININE 1.49* 1.42*  CALCIUM 8.3* 8.2*   Liver Function Tests: No results for input(s): AST, ALT, ALKPHOS, BILITOT, PROT, ALBUMIN in the last 72 hours. No results for input(s): LIPASE, AMYLASE in the last 72 hours. CBC:  Recent Labs  08/25/14 0430 06-Dec-2014 0358  WBC 7.9 6.5  HGB 12.2* 12.1*  HCT 36.0* 36.3*  MCV 92.3 92.1  PLT 289 323   Cardiac Enzymes: No results for input(s): CKTOTAL, CKMB, CKMBINDEX, TROPONINI in the last 72 hours. BNP: Invalid input(s): POCBNP D-Dimer: No results for input(s): DDIMER in the last 72 hours. Hemoglobin A1C: No results for input(s): HGBA1C in the last 72 hours. Fasting Lipid Panel: No results for input(s): CHOL, HDL, LDLCALC, TRIG, CHOLHDL, LDLDIRECT in the last 72 hours. Thyroid Function Tests: No results for input(s): TSH, T4TOTAL, T3FREE, THYROIDAB in the last 72 hours.  Invalid input(s):  FREET3 Anemia Panel: No results for input(s): VITAMINB12, FOLATE, FERRITIN, TIBC, IRON, RETICCTPCT in the last 72 hours. Coag Panel:   Lab Results  Component Value Date   INR 1.16 02/14/2015   INR 1.13 08/07/2014    RADIOLOGY: Dg Chest 2 View  08/03/2014   CLINICAL DATA:  Bilateral leg swelling. Back surgery 2 weeks ago. Persistent shortness of breath. Weakness.  EXAM: CHEST  2 VIEW  COMPARISON:  08/07/2014  FINDINGS: Moderate enlargement of the cardiopericardial silhouette. Atherosclerotic calcification of the aortic arch.  Airspace opacity in the lingula and left lower lobe with some associated adjacent pleural effusion. Suspected small right pleural effusion in the posterior costophrenic angle as well.  IMPRESSION: 1. Airspace opacity at the left lung base in excess of the passive atelectasis from the pleural effusion. Appearance could reflect atelectasis or pneumonia. 2. Small left and trace right pleural effusion. 3. Moderate enlargement of the cardiopericardial silhouette.   Electronically Signed   By: Gaylyn RongWalter  Liebkemann M.D.   On: 08/02/2014 21:54   Dg Chest 2 View  08/07/2014   CLINICAL DATA:  Preop for lumbar surgery.  EXAM: CHEST  2 VIEW  COMPARISON:  08/11/2007  FINDINGS: Small bilateral pleural effusions. Borderline cardiomegaly. There is moderate aortic tortuosity which is stable from prior. Stable calcification  overlapping the heart, valvular versus coronary. Mild atelectasis in the lower lungs. No convincing pneumonia or edema.  IMPRESSION: Small bilateral pleural effusion and mild basilar atelectasis.   Electronically Signed   By: Marnee Spring M.D.   On: 08/07/2014 10:44   Dg Lumbar Spine 2-3 Views  08/07/2014   CLINICAL DATA:  L3 kyphoplasty  EXAM: DG C-ARM 61-120 MIN; LUMBAR SPINE - 2-3 VIEW  TECHNIQUE: AP and lateral fluoroscopic spot views  FLUOROSCOPY TIME:  Radiation Exposure Index (as provided by the fluoroscopic device):  If the device does not provide the exposure index:   Fluoroscopy Time (in minutes and seconds):  2 minutes and 16 seconds  Number of Acquired Images:  2  COMPARISON:  07/17/2014  FINDINGS: Mild compression deformity at L3 status post kyphoplasty. Cement projects over the vertebral body.  IMPRESSION: Anticipated appearance status post kyphoplasty.   Electronically Signed   By: Esperanza Heir M.D.   On: 08/07/2014 16:00   Dg C-arm 1-60 Min  08/07/2014   CLINICAL DATA:  L3 kyphoplasty  EXAM: DG C-ARM 61-120 MIN; LUMBAR SPINE - 2-3 VIEW  TECHNIQUE: AP and lateral fluoroscopic spot views  FLUOROSCOPY TIME:  Radiation Exposure Index (as provided by the fluoroscopic device):  If the device does not provide the exposure index:  Fluoroscopy Time (in minutes and seconds):  2 minutes and 16 seconds  Number of Acquired Images:  2  COMPARISON:  07/17/2014  FINDINGS: Mild compression deformity at L3 status post kyphoplasty. Cement projects over the vertebral body.  IMPRESSION: Anticipated appearance status post kyphoplasty.   Electronically Signed   By: Esperanza Heir M.D.   On: 08/07/2014 16:00   ASSESSMENT AND PLAN:    1. Acute combined systolic/diastolic CHF: EF 40-98% by echo this admission. RHC/ LHC planned for today.  Lasix on hold for cath.   No acei/arb/arni/spiro/hydralazine/nitrate in setting of intermittent hypotension.  2. Severe MR: Noted on echo. 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  Will need repair pending results of cath.    3. Nstemi/elevated troponin: In setting of above. R & L heart cath today to further eval. Cont asa, high dose statin. Continue heparin gtt for now  4. Acute on chronic stage III kidney dzs: improved and near baseline  5. Normocytic anemia   Quintella Reichert, MD  08/01/2014  12:12 PM

## 2014-08-26 NOTE — CV Procedure (Signed)
    Transesophageal Echocardiogram Note  Darren HarrisonRichard M [name] [id number] 08/09/1940  Procedure: Transesophageal Echocardiogram Indications: mitral regurgitation   Procedure Details Consent: Obtained Time Out: Verified patient identification, verified procedure, site/side was marked, verified correct patient position, special equipment/implants available, Radiology Safety Procedures followed,  medications/allergies/relevent history reviewed, required imaging and test results available.  Performed  Medications: Fentanyl: 25 mcg iv  Versed: 3 mg iv   Left Ventrical:  Moderate LV dysfunction.  EF ~ 40%  Mitral Valve: there is severe prolapse of the posterior leaflet of the MV.  There is a partially flail scallop.  No evidence of vegetation.  There is severe eccentric MR directed anteriorly.    Aortic Valve: mild AI  Tricuspid Valve: mild - mod. TR  Pulmonic Valve: trace PI  Left Atrium/ Left atrial appendage: no thrombi  Right atrium:  Chiari network seen.  Atrial septum: possible PFO with  left to right shunting   Aorta: mild atheroma,    Complications: No apparent complications Patient did tolerate procedure well.   Vesta MixerPhilip J. Nahser, [name], MD, [facility] CenterFACC December 18, 2014, 11:17 AM

## 2014-08-26 NOTE — Progress Notes (Signed)
Condom cath placed on pt per family request.

## 2014-08-26 NOTE — Interval H&P Note (Signed)
History and Physical Interval Note:  May 15, 2015 2:30 PM  Darren [name]  has presented today for surgery, with the diagnosis of cp  The various methods of treatment have been discussed with the patient and family. After consideration of risks, benefits and other options for treatment, the patient has consented to  Procedure(s): LEFT AND RIGHT HEART CATHETERIZATION WITH CORONARY ANGIOGRAM (N/A) as a surgical intervention .  The patient's history has been reviewed, patient examined, no change in status, stable for surgery.  I have reviewed the patient's chart and labs.  Questions were answered to the patient's satisfaction.   Cath Lab Visit (complete for each Cath Lab visit)  Clinical Evaluation Leading to the Procedure:   ACS: No.  Non-ACS:    Anginal Classification: CCS III  Anti-ischemic medical therapy: Minimal Therapy (1 class of medications)  Non-Invasive Test Results: No non-invasive testing performed  Prior CABG: No previous CABG        Theron AristaPeter Community HospitalJordanMD,FACC May 15, 2015 2:30 PM

## 2014-08-26 NOTE — Progress Notes (Signed)
PT Cancellation Note  Patient Details Name: Georgeanna HarrisonRichard M Garant MRN: [id number] DOB: 02/19/1941   Cancelled Treatment:    Reason Eval/Treat Not Completed: Patient at procedure or test/unavailable.  Patient currently off unit for procedures - patient for TEE and heart cath today per chart.  Will return at later time for PT session.   Vena AustriaDavis, Oliver Neuwirth H 08/05/2014, 11:12 AM Durenda HurtSusan H. Renaldo Fiddleravis, PT, [facility] LLCMBA Acute Rehab Services Pager [phone]

## 2014-08-26 NOTE — Progress Notes (Signed)
Site area: rt groin Site Prior to Removal:  Level 0 Pressure Applied For: 20 minutes Manual:   yes Patient Status During Pull:  stable Post Pull Site:  Level  0 Post Pull Instructions Given:  yes Post Pull Pulses Present: yes Dressing Applied:  tegaderm Bedrest begins @  1600 Comments: 0 complications

## 2014-08-26 NOTE — Progress Notes (Signed)
  Echocardiogram Echocardiogram Transesophageal has been performed.  Janalyn HarderWest, Shiron Whetsel R Mar 29, 2015, 11:29 AM

## 2014-08-27 ENCOUNTER — Inpatient Hospital Stay (HOSPITAL_COMMUNITY): Payer: Medicare Other

## 2014-08-27 ENCOUNTER — Encounter (HOSPITAL_COMMUNITY): Payer: Self-pay | Admitting: Cardiovascular Disease

## 2014-08-27 DIAGNOSIS — I5043 Acute on chronic combined systolic (congestive) and diastolic (congestive) heart failure: Secondary | ICD-10-CM

## 2014-08-27 DIAGNOSIS — I340 Nonrheumatic mitral (valve) insufficiency: Secondary | ICD-10-CM

## 2014-08-27 DIAGNOSIS — Z01810 Encounter for preprocedural cardiovascular examination: Secondary | ICD-10-CM

## 2014-08-27 DIAGNOSIS — I2510 Atherosclerotic heart disease of native coronary artery without angina pectoris: Secondary | ICD-10-CM

## 2014-08-27 LAB — PULMONARY FUNCTION TEST
DL/VA % pred: 61 %
DL/VA: 2.71 ml/min/mmHg/L
DLCO COR % PRED: 20 %
DLCO COR: 5.8 ml/min/mmHg
DLCO UNC % PRED: 18 %
DLCO UNC: 5.24 ml/min/mmHg
FEF 25-75 Post: 0.7 L/sec
FEF 25-75 Pre: 0.42 L/sec
FEF2575-%Change-Post: 67 %
FEF2575-%PRED-POST: 34 %
FEF2575-%Pred-Pre: 20 %
FEV1-%CHANGE-POST: 21 %
FEV1-%PRED-POST: 40 %
FEV1-%Pred-Pre: 33 %
FEV1-POST: 1.11 L
FEV1-Pre: 0.92 L
FEV1FVC-%CHANGE-POST: 12 %
FEV1FVC-%Pred-Pre: 71 %
FEV6-%Change-Post: 8 %
FEV6-%PRED-PRE: 48 %
FEV6-%Pred-Post: 52 %
FEV6-Post: 1.87 L
FEV6-Pre: 1.72 L
FEV6FVC-%CHANGE-POST: 0 %
FEV6FVC-%PRED-PRE: 105 %
FEV6FVC-%Pred-Post: 106 %
FVC-%Change-Post: 7 %
FVC-%Pred-Post: 49 %
FVC-%Pred-Pre: 46 %
FVC-PRE: 1.75 L
FVC-Post: 1.89 L
PRE FEV6/FVC RATIO: 98 %
Post FEV1/FVC ratio: 59 %
Post FEV6/FVC ratio: 99 %
Pre FEV1/FVC ratio: 52 %

## 2014-08-27 LAB — CBC
HEMATOCRIT: 35.1 % — AB (ref 39.0–52.0)
HEMOGLOBIN: 11.6 g/dL — AB (ref 13.0–17.0)
MCH: 30.4 pg (ref 26.0–34.0)
MCHC: 33 g/dL (ref 30.0–36.0)
MCV: 92.1 fL (ref 78.0–100.0)
Platelets: 293 10*3/uL (ref 150–400)
RBC: 3.81 MIL/uL — ABNORMAL LOW (ref 4.22–5.81)
RDW: 15.5 % (ref 11.5–15.5)
WBC: 5.9 10*3/uL (ref 4.0–10.5)

## 2014-08-27 LAB — BASIC METABOLIC PANEL
ANION GAP: 11 (ref 5–15)
BUN: 30 mg/dL — ABNORMAL HIGH (ref 6–23)
CHLORIDE: 105 mmol/L (ref 96–112)
CO2: 21 mmol/L (ref 19–32)
Calcium: 8.8 mg/dL (ref 8.4–10.5)
Creatinine, Ser: 1.52 mg/dL — ABNORMAL HIGH (ref 0.50–1.35)
GFR calc Af Amer: 51 mL/min — ABNORMAL LOW (ref >90)
GFR, EST NON AFRICAN AMERICAN: 44 mL/min — AB (ref >90)
Glucose, Bld: 130 mg/dL — ABNORMAL HIGH (ref 70–99)
POTASSIUM: 4.5 mmol/L (ref 3.5–5.1)
Sodium: 137 mmol/L (ref 135–145)

## 2014-08-27 LAB — HEPARIN LEVEL (UNFRACTIONATED): Heparin Unfractionated: 0.34 IU/mL (ref 0.30–0.70)

## 2014-08-27 MED ORDER — ALBUTEROL SULFATE (2.5 MG/3ML) 0.083% IN NEBU
2.5000 mg | INHALATION_SOLUTION | Freq: Once | RESPIRATORY_TRACT | Status: AC
  Administered 2014-08-27: 2.5 mg via RESPIRATORY_TRACT

## 2014-08-27 MED ORDER — FUROSEMIDE 10 MG/ML IJ SOLN
40.0000 mg | Freq: Once | INTRAMUSCULAR | Status: AC
  Administered 2014-08-27: 40 mg via INTRAVENOUS
  Filled 2014-08-27: qty 4

## 2014-08-27 NOTE — Progress Notes (Signed)
Pt with 25ml output since 9AM according to chart. Pt went for CATH and pt states he does not remember if he voided today. Pt currently has condom cath on. Bladder scan volume . Will continue to monitor

## 2014-08-27 NOTE — Progress Notes (Signed)
ANTICOAGULATION CONSULT NOTE - Follow Up Consult  Pharmacy Consult for Heparin Indication: severe 3v CAD  No Known Allergies  Patient Measurements: Height: 5\' 7"  (170.2 cm) Weight: 135 lb 3.2 oz (61.326 kg) (scale B) IBW/kg (Calculated) : 66.1  Vital Signs: Temp: 97.3 F (36.3 C) (03/01 0624) Temp Source: Oral (03/01 0624) BP: 117/80 mmHg (03/01 0624) Pulse Rate: 79 (03/01 0624)  Labs:  Recent Labs  08/25/14 0430 08/25/14 1205 08/13/2014 0358 08/27/14 0554 08/27/14 1141  HGB 12.2*  --  12.1* 11.6*  --   HCT 36.0*  --  36.3* 35.1*  --   PLT 289  --  323 293  --   LABPROT  --   --  14.9  --   --   INR  --   --  1.16  --   --   HEPARINUNFRC 0.22* 0.43 0.51  --  0.34  CREATININE  --   --  1.42*  --   --     Estimated Creatinine Clearance: 40.2 mL/min (by C-G formula based on Cr of 1.42).  Assessment: 74 year old male awaiting CVTS consult for severe 3 v disease Heparin level therapeutic this afternoon  Goal of Therapy:  Heparin level 0.3-0.7 units/ml Monitor platelets by anticoagulation protocol: Yes   Plan:  Continue heparin at 1050 units / hr Follow up AM labs  Thank you. Okey RegalLisa Melvenia Favela, PharmD [phone]  Elwin SleightPowell, Yaret Hush Kay 08/27/2014,12:41 PM

## 2014-08-27 NOTE — Progress Notes (Signed)
Physical Therapy Treatment Patient Details Name: Darren [name] MRN: [id number] DOB: Jan 09, 1941 Today's Date: 08/27/2014    History of Present Illness 59M smoker with HTN, HLD, GERD, CVA, CKD (baseline Cr 1.6), with L3 compression fracture s/p kyphoplasty on 2/10and history of MR who presents with new onset CHF and NSTEMI. Pt being worked up for possible CABG.    PT Comments    Pt making steady progress. If pt has CABG he may need ST-SNF.  Follow Up Recommendations  Home health PT (may need ST-SNF if has a CABG)     Equipment Recommendations  None recommended by PT    Recommendations for Other Services       Precautions / Restrictions Precautions Precautions: Fall    Mobility  Bed Mobility Overal bed mobility: Needs Assistance Bed Mobility: Supine to Sit;Sit to Supine     Supine to sit: Min assist Sit to supine: Min assist   General bed mobility comments: Assist to elevate trunk into sitting and to bring legs back up into bed when returning to supine.  Transfers Overall transfer level: Needs assistance Equipment used: Rolling walker (2 wheeled) Transfers: Sit to/from Stand Sit to Stand: Min assist         General transfer comment: Assist to bring hips up..  Ambulation/Gait Ambulation/Gait assistance: Min assist Ambulation Distance (Feet): 120 Feet Assistive device: Rolling walker (2 wheeled) Gait Pattern/deviations: Step-through pattern;Decreased step length - right;Decreased step length - left;Decreased dorsiflexion - left;Narrow base of support Gait velocity: decreased Gait velocity interpretation: Below normal speed for age/gender General Gait Details: Pt with lt foot drop and with very narrow support with lt foot hitting rt foot on swing phase. Pt also with decr hip/knee flexion with swing phase.   Stairs            Wheelchair Mobility    Modified Rankin (Stroke Patients Only)       Balance Overall balance assessment: Needs  assistance Sitting-balance support: No upper extremity supported Sitting balance-Leahy Scale: Good     Standing balance support: Bilateral upper extremity supported Standing balance-Leahy Scale: Poor Standing balance comment: support of walker                    Cognition Arousal/Alertness: Awake/alert Behavior During Therapy: WFL for tasks assessed/performed Overall Cognitive Status: Within Functional Limits for tasks assessed                      Exercises      General Comments        Pertinent Vitals/Pain Pain Assessment: No/denies pain    Home Living                      Prior Function            PT Goals (current goals can now be found in the care plan section) Progress towards PT goals: Progressing toward goals    Frequency  Min 3X/week    PT Plan Current plan remains appropriate    Co-evaluation             End of Session Equipment Utilized During Treatment: Gait belt;Oxygen Activity Tolerance: Patient tolerated treatment well Patient left: in bed;with call bell/phone within reach;with family/visitor present     Time: 2956-2130 PT Time Calculation (min) (ACUTE ONLY): 22 min  Charges:  $Gait Training: 8-22 mins  G Codes:      Titus Drone 08/27/2014, 2:01 PM  [facility]ridgeCary Cherae Marton PT [phone]

## 2014-08-27 NOTE — Progress Notes (Signed)
Patient Profile: 70M smoker with HTN, HLD, GERD, CVA, CKD (baseline Cr 1.6), with L3 compression fracture s/p kyphoplasty on 2/10 and history of MR who due to MVP admitted 09/17/14 with new onset CHF and NSTEMI.  Subjective: Currently CP free. No dyspnea.   Objective: Vital signs in last 24 hours: Temp:  [97.3 F (36.3 C)-97.5 F (36.4 C)] 97.3 F (36.3 C) (03/01 0624) Pulse Rate:  [41-83] 79 (03/01 0624) Resp:  [17-36] 18 (03/01 0624) BP: (90-132)/(63-104) 117/80 mmHg (03/01 0624) SpO2:  [96 %-100 %] 100 % (03/01 0624) Weight:  [135 lb 3.2 oz (61.326 kg)] 135 lb 3.2 oz (61.326 kg) (03/01 0624) Last BM Date: 08/25/14  Intake/Output from previous day: 02/29 0701 - 03/01 0700 In: 432.2 [P.O.:120; I.V.:312.2] Out: 500 [Urine:500] Intake/Output this shift:    Medications Current Facility-Administered Medications  Medication Dose Route Frequency Provider Last Rate Last Dose  . 0.9 %  sodium chloride infusion  250 mL Intravenous PRN Glori Luis, MD 10 mL/hr at 08/24/14 1900 250 mL at 08/24/14 1900  . acetaminophen (TYLENOL) tablet 650 mg  650 mg Oral Q4H PRN Glori Luis, MD      . aspirin EC tablet 81 mg  81 mg Oral Daily Corky Crafts, MD      . atorvastatin (LIPITOR) tablet 80 mg  80 mg Oral q1800 Glori Luis, MD   80 mg at 08/25/2014 1711  . heparin ADULT infusion 100 units/mL (25000 units/250 mL)  1,050 Units/hr Intravenous Continuous Fredrik Rigger, RPH 10.5 mL/hr at 08/27/14 0004 1,050 Units/hr at 08/27/14 0004  . ondansetron (ZOFRAN) injection 4 mg  4 mg Intravenous Q6H PRN Glori Luis, MD      . potassium chloride SA (K-DUR,KLOR-CON) CR tablet 20 mEq  20 mEq Oral Daily Ok Anis, NP   20 mEq at 08/25/14 1048  . sodium chloride 0.9 % injection 3 mL  3 mL Intravenous Q12H Glori Luis, MD   3 mL at 08/24/14 2252  . sodium chloride 0.9 % injection 3 mL  3 mL Intravenous PRN Glori Luis, MD        PE: General appearance: alert,  cooperative and no distress Neck: no carotid bruit and no JVD Lungs: decreased BS at the bases Heart: regular rate and rhythm and 3/6 harsh holosystolic murmur Extremities: 2+ bilateral LEE pitting edema Pulses: 2+ and symmetric Skin: warm and dry Neurologic: Grossly normal  Lab Results:   Recent Labs  08/25/14 0430 07/31/2014 0358 08/27/14 0554  WBC 7.9 6.5 5.9  HGB 12.2* 12.1* 11.6*  HCT 36.0* 36.3* 35.1*  PLT 289 323 293   BMET  Recent Labs  07/29/2014 0358  NA 132*  K 4.6  CL 105  CO2 22  GLUCOSE 113*  BUN 35*  CREATININE 1.42*  CALCIUM 8.2*   PT/INR  Recent Labs  08/22/2014 0358  LABPROT 14.9  INR 1.16    Studies/Results: LHC 08/20/2014 Procedural Findings: Hemodynamics RA 16/17 mean 15 mm Hg RV 54/10/16 mm Hg PA 58/24 mean 36 mm Hg PCWP 32/51 mean 32 mm Hg, very large V waves LV 134/23/37 mm Hg AO 134/76 mean 101 mm Hg  Oxygen saturations: RA 82% RV 80% PA 80% AO 99%  Cardiac Output (Fick) 8.0 L/min  Cardiac Index (Fick) 4.76 L/min/meter squared.  Coronary angiography: Coronary dominance: right  Left mainstem: Normal  Left anterior descending (LAD): 80-90% proximal. Diffuse disease in the proximal vessel.   Left circumflex (LCx): 80% proximal.  Right coronary artery (RCA): Diffuse  disease in the proximal vessel up to 70%. The mid vessel is 100% occluded after the RV marginal branch.   Left ventriculography: Not done  Final Conclusions:  1. Severe 3 vessel obstructive CAD 2. Elevated LV filling pressures with large V waves and high cardiac output consistent with severe MR. 3. Moderate pulmonary HTN  Assessment/Plan  Principal Problem:   Acute combined systolic and diastolic CHF, NYHA class 3 Active Problems:   Elevated troponin   Severe mitral regurgitation   Congestive dilated cardiomyopathy   Acute renal failure superimposed on stage 3 chronic kidney disease   Hypokalemia   Normocytic anemia   1. CAD: CP free on  IV heparin. LHC revealed 3 vessel obstructive CAD with 80-90% proximal LAD stenosis, 80% proximal LCx and 70% proximal RCA with 100% mid occlusion after the RV marginal branch. Assessment for potential CABG pending. Patient undergoing PFTs today. Continue medical therapy for now.   2. Severe MR secondary to MVP: surgical consult pending.    3. Acute Systolic CHF: EF 95-28%40-45% on echo. No dyspnea but will significant peripheral edema. Continue IV Lasix. Monitor renal function.     LOS: 7 days    Brittainy M. Sharol HarnessSimmons, PA-C 08/27/2014 9:50 AM

## 2014-08-27 NOTE — Progress Notes (Signed)
VASCULAR LAB PRELIMINARY  PRELIMINARY  PRELIMINARY  PRELIMINARY  Pre-op Cardiac Surgery  Carotid Findings:  Bilateral:  1-39% ICA stenosis.  Vertebral artery flow is antegrade.      Upper Extremity Right Left  Brachial Pressures 115  Triphasic  108  Triphasic   Radial Waveforms Triphasic  Triphasic   Ulnar Waveforms Triphasic  Triphasic   Palmar Arch (Allen's Test) Within normal limits  Within normal limits      Lower  Extremity Right Left  Dorsalis Pedis    Anterior Tibial Palpable   Posterior Tibial    Ankle/Brachial Indices       Darren [name], RVT 08/27/2014, 11:00 AM

## 2014-08-27 NOTE — Consult Note (Signed)
Reason for Consult:3 vesel CAD and severe MR Referring Physician: Drs. Jordan/ Nahser  NYMIR RINGLER is an 74 y.o. male.  HPI: 74 yo man who presented on 08/24/2014 with cc/o shortness of breath and leg swelling.   He says he was in his usual state of health until about a month ago. He woke up one morning with back pain. He had compression fractures and required kyphoplasty. His family noted that about 2 days after the procedure he developed leg swelling. He became progressively more short of breath ovet the next week and his swelling worsened.   His family brought him to the ED. He was noted have peripheral edema and a heart murmur. He had CHF and bilateral effusions on chest x-ray. His BNP and troponin were elevated. He was admitted. An echo showed severe MR with posterior leaflet prolapse. EF was 30- 35 %. He underwent cardiac catheterization yesterday which revealed moderate pulmonary hypertension and severe 3 vessel CAD.  His symptoms have improved since admission.   Past Medical History  Diagnosis Date  . Hypertension   . Hyperlipidemia   . GERD (gastroesophageal reflux disease)   . Carotid bruit   . Stroke     uses walker  . Shortness of breath dyspnea     with exertion   . Arthritis   . CHF (congestive heart failure) 07/2014    " NEW ONSET "    Past Surgical History  Procedure Laterality Date  . Hernia repair    . Tonsillectomy    . Mouth surgery    . Kyphoplasty N/A 08/07/2014    Procedure:  Lumbar Three Kyphoplasty;  Surgeon: Sinclair Ship, MD;  Location: [facility];  Service: Orthopedics;  Laterality: N/A;  Lumbar 3 kyphoplasty  . Left and right heart catheterization with coronary angiogram N/A 08/12/2014    Procedure: LEFT AND RIGHT HEART CATHETERIZATION WITH CORONARY ANGIOGRAM;  Surgeon: Peter M Martinique, MD;  Location: [facility] CATH LAB;  Service: Cardiovascular;  Laterality: N/A;  . Tee without cardioversion N/A 07/29/2014    Procedure: TRANSESOPHAGEAL ECHOCARDIOGRAM  (TEE);  Surgeon: Thayer Headings, MD;  Location: [facility] ENDOSCOPY;  Service: Cardiovascular;  Laterality: N/A;    Family History  Problem Relation Age of Onset  . Alzheimer's disease Mother   . Dementia Mother   . Emphysema Father   . Hypertension Sister   . Hypertension Brother   . Cancer Sister     Social History:  reports that he has been smoking Cigarettes.  He has a 5.7 pack-year smoking history. He has never used smokeless tobacco. He reports that he does not drink alcohol or use illicit drugs.  Allergies: No Known Allergies  Medications:  Scheduled: . aspirin EC  81 mg Oral Daily  . atorvastatin  80 mg Oral q1800  . potassium chloride  20 mEq Oral Daily  . sodium chloride  3 mL Intravenous Q12H    Results for orders placed or performed during the hospital encounter of 08/17/2014 (from the past 48 hour(s))  Heparin level (unfractionated)     Status: None   Collection Time: 08/21/2014  3:58 AM  Result Value Ref Range   Heparin Unfractionated 0.51 0.30 - 0.70 IU/mL    Comment:        IF HEPARIN RESULTS ARE BELOW EXPECTED VALUES, AND PATIENT DOSAGE HAS BEEN CONFIRMED, SUGGEST FOLLOW UP TESTING OF ANTITHROMBIN III LEVELS.   CBC     Status: Abnormal   Collection Time: 08/16/2014  3:58 AM  Result  Value Ref Range   WBC 6.5 4.0 - 10.5 K/uL   RBC 3.94 (L) 4.22 - 5.81 MIL/uL   Hemoglobin 12.1 (L) 13.0 - 17.0 g/dL   HCT 36.3 (L) 39.0 - 52.0 %   MCV 92.1 78.0 - 100.0 fL   MCH 30.7 26.0 - 34.0 pg   MCHC 33.3 30.0 - 36.0 g/dL   RDW 15.5 11.5 - 15.5 %   Platelets 323 150 - 400 K/uL  Basic metabolic panel     Status: Abnormal   Collection Time: 08/15/2014  3:58 AM  Result Value Ref Range   Sodium 132 (L) 135 - 145 mmol/L   Potassium 4.6 3.5 - 5.1 mmol/L   Chloride 105 96 - 112 mmol/L   CO2 22 19 - 32 mmol/L   Glucose, Bld 113 (H) 70 - 99 mg/dL   BUN 35 (H) 6 - 23 mg/dL   Creatinine, Ser 1.42 (H) 0.50 - 1.35 mg/dL   Calcium 8.2 (L) 8.4 - 10.5 mg/dL   GFR calc non Af Amer 47 (L)  >90 mL/min   GFR calc Af Amer 55 (L) >90 mL/min    Comment: (NOTE) The eGFR has been calculated using the CKD EPI equation. This calculation has not been validated in all clinical situations. eGFR's persistently <90 mL/min signify possible Chronic Kidney Disease.    Anion gap 5 5 - 15  Protime-INR     Status: None   Collection Time: 08/15/2014  3:58 AM  Result Value Ref Range   Prothrombin Time 14.9 11.6 - 15.2 seconds   INR 1.16 0.00 - 1.49  I-STAT 3, venous blood gas (G3P V)     Status: Abnormal   Collection Time: 08/04/2014  2:54 PM  Result Value Ref Range   pH, Ven 7.390 (H) 7.250 - 7.300   pCO2, Ven 31.9 (L) 45.0 - 50.0 mmHg   pO2, Ven 44.0 30.0 - 45.0 mmHg   Bicarbonate 19.3 (L) 20.0 - 24.0 mEq/L   TCO2 20 0 - 100 mmol/L   O2 Saturation 80.0 %   Acid-base deficit 5.0 (H) 0.0 - 2.0 mmol/L   Sample type VENOUS   I-STAT 3, arterial blood gas (G3+)     Status: Abnormal   Collection Time: 08/05/2014  3:01 PM  Result Value Ref Range   pH, Arterial 7.451 (H) 7.350 - 7.450   pCO2 arterial 27.6 (L) 35.0 - 45.0 mmHg   pO2, Arterial 111.0 (H) 80.0 - 100.0 mmHg   Bicarbonate 19.2 (L) 20.0 - 24.0 mEq/L   TCO2 20 0 - 100 mmol/L   O2 Saturation 99.0 %   Acid-base deficit 4.0 (H) 0.0 - 2.0 mmol/L   Sample type ARTERIAL   I-STAT 3, venous blood gas (G3P V)     Status: Abnormal   Collection Time: 08/18/2014  3:03 PM  Result Value Ref Range   pH, Ven 7.402 (H) 7.250 - 7.300   pCO2, Ven 31.9 (L) 45.0 - 50.0 mmHg   pO2, Ven 43.0 30.0 - 45.0 mmHg   Bicarbonate 19.8 (L) 20.0 - 24.0 mEq/L   TCO2 21 0 - 100 mmol/L   O2 Saturation 80.0 %   Acid-base deficit 4.0 (H) 0.0 - 2.0 mmol/L   Sample type VENOUS   I-STAT 3, venous blood gas (G3P V)     Status: Abnormal   Collection Time: 08/21/2014  3:05 PM  Result Value Ref Range   pH, Ven 7.387 (H) 7.250 - 7.300   pCO2, Ven 32.7 (L) 45.0 -  50.0 mmHg   pO2, Ven 46.0 (H) 30.0 - 45.0 mmHg   Bicarbonate 19.7 (L) 20.0 - 24.0 mEq/L   TCO2 21 0 - 100 mmol/L    O2 Saturation 82.0 %   Acid-base deficit 5.0 (H) 0.0 - 2.0 mmol/L   Sample type VENOUS   POCT Activated clotting time     Status: None   Collection Time: 08/01/2014  3:11 PM  Result Value Ref Range   Activated Clotting Time 91 seconds  CBC     Status: Abnormal   Collection Time: 08/27/14  5:54 AM  Result Value Ref Range   WBC 5.9 4.0 - 10.5 K/uL   RBC 3.81 (L) 4.22 - 5.81 MIL/uL   Hemoglobin 11.6 (L) 13.0 - 17.0 g/dL   HCT 35.1 (L) 39.0 - 52.0 %   MCV 92.1 78.0 - 100.0 fL   MCH 30.4 26.0 - 34.0 pg   MCHC 33.0 30.0 - 36.0 g/dL   RDW 15.5 11.5 - 15.5 %   Platelets 293 150 - 400 K/uL  Heparin level (unfractionated)     Status: None   Collection Time: 08/27/14 11:41 AM  Result Value Ref Range   Heparin Unfractionated 0.34 0.30 - 0.70 IU/mL    Comment:        IF HEPARIN RESULTS ARE BELOW EXPECTED VALUES, AND PATIENT DOSAGE HAS BEEN CONFIRMED, SUGGEST FOLLOW UP TESTING OF ANTITHROMBIN III LEVELS.   Basic metabolic panel     Status: Abnormal   Collection Time: 08/27/14 11:52 AM  Result Value Ref Range   Sodium 137 135 - 145 mmol/L   Potassium 4.5 3.5 - 5.1 mmol/L   Chloride 105 96 - 112 mmol/L   CO2 21 19 - 32 mmol/L   Glucose, Bld 130 (H) 70 - 99 mg/dL   BUN 30 (H) 6 - 23 mg/dL   Creatinine, Ser 1.52 (H) 0.50 - 1.35 mg/dL   Calcium 8.8 8.4 - 10.5 mg/dL   GFR calc non Af Amer 44 (L) >90 mL/min   GFR calc Af Amer 51 (L) >90 mL/min    Comment: (NOTE) The eGFR has been calculated using the CKD EPI equation. This calculation has not been validated in all clinical situations. eGFR's persistently <90 mL/min signify possible Chronic Kidney Disease.    Anion gap 11 5 - 15    No results found.  Review of Systems  Constitutional: Positive for malaise/fatigue. Negative for fever and chills.  Respiratory: Positive for shortness of breath.   Cardiovascular: Positive for chest pain and leg swelling.  Gastrointestinal: Positive for constipation.  Musculoskeletal: Positive for  back pain.  All other systems reviewed and are negative.  Blood pressure 100/66, pulse 74, temperature 97.1 F (36.2 C), temperature source Oral, resp. rate 18, height 5' 7" (1.702 m), weight 135 lb 3.2 oz (61.326 kg), SpO2 99 %. Physical Exam  Vitals reviewed. Constitutional: He is oriented to person, place, and time.  Appear older than stated age  HENT:  Head: Normocephalic and atraumatic.  dentures  Eyes: EOM are normal. Pupils are equal, round, and reactive to light.  Neck: Neck supple. No thyromegaly present.  Cardiovascular: Normal rate and regular rhythm.   Murmur (3/6 early systolic) heard. Respiratory: Effort normal.  Diminished BS bilaterally L>R   GI: Soft. There is no tenderness.  Musculoskeletal: He exhibits edema (1+).  Lymphadenopathy:    He has no cervical adenopathy.  Neurological: He is alert and oriented to person, place, and time. No cranial nerve deficit.  No  focal motor deficit  Skin: Skin is warm and dry.   Echocardiogram Transesophageal Echocardiography  Patient:  Ezechiel, [place] MR #:    [id number] Study Date: 08/08/2014 Gender:   M Age:    73 Height:   175.3 cm Weight:   58.2 kg BSA:    1.67 m^2 Pt. Status: Room:    3E29C  PERFORMING  Mertie Moores, M.D. ORDERING   Modesto Charon, MD ATTENDING  Delora Fuel [id number] [place], MD ADMITTING  Larae Grooms S SONOGRAPHER Roseanna Rainbow  cc:  ------------------------------------------------------------------- LV EF: 30% -  35%  ------------------------------------------------------------------- Indications:   424.0 Mitral valve disease.  ------------------------------------------------------------------- History:  PMH: Dilated cardiomyopathy. Severe MR. Elevated troponin. Congestive heart failure.  ------------------------------------------------------------------- Study Conclusions  - Left ventricle: Systolic  function was moderately to severely reduced. The estimated ejection fraction was in the range of 30% to 35%. - Aortic valve: No evidence of vegetation. There was mild regurgitation. - Mitral valve: Severe prolapse, involving the posterior leaflet. There was severe regurgitation. - Left atrium: No evidence of thrombus in the atrial cavity or appendage. - Tricuspid valve: No evidence of vegetation.  Diagnostic transesophageal echocardiography. 2D and color Doppler. Birthdate: Patient birthdate: 09/05/1940. Age: Patient is 74 yr old. Sex: Gender: male.  BMI: 18.9 kg/m^2. Blood pressure: 92/68 Patient status: Inpatient. Study date: Study date: 08/24/2014. Study time: 10:39 AM. Location: Endoscopy.  -------------------------------------------------------------------  ------------------------------------------------------------------- Left ventricle: Systolic function was moderately to severely reduced. The estimated ejection fraction was in the range of 30% to 35%.  ------------------------------------------------------------------- Aortic valve:  Structurally normal valve.  Cusp separation was normal. No evidence of vegetation. Doppler: There was mild regurgitation.  ------------------------------------------------------------------- Aorta: The aorta was mildly calcified.  ------------------------------------------------------------------- Mitral valve: There is prolapse of the posterior leaflet of the mitral valve . this is associated with severe MR. Severe prolapse, involving the posterior leaflet. Doppler: There was severe regurgitation.  ------------------------------------------------------------------- Left atrium:  No evidence of thrombus in the atrial cavity or appendage.  ------------------------------------------------------------------- Pulmonic valve:  Doppler: There was trivial  regurgitation.  ------------------------------------------------------------------- Tricuspid valve:  Structurally normal valve.  Leaflet separation was normal. No evidence of vegetation. Doppler: There was mild regurgitation.  ------------------------------------------------------------------- Post procedure conclusions Ascending Aorta:  - The aorta was mildly calcified.  ------------------------------------------------------------------- Prepared and Electronically Authenticated by  Mertie Moores, M.D. 2016-02-29T18:20:43  Cardiac catheterization Procedural Findings: Hemodynamics RA 16/17 mean 15 mm Hg RV 54/10/16 mm Hg PA 58/24 mean 36 mm Hg PCWP 32/51 mean 32 mm Hg, very large V waves LV 134/23/37 mm Hg AO 134/76 mean 101 mm Hg  Oxygen saturations: RA 82% RV 80% PA 80% AO 99%  Cardiac Output (Fick) 8.0 L/min  Cardiac Index (Fick) 4.76 L/min/meter squared.  Coronary angiography: Coronary dominance: right  Left mainstem: Normal  Left anterior descending (LAD): 80-90% proximal. Diffuse disease in the proximal vessel.   Left circumflex (LCx): 80% proximal.  Right coronary artery (RCA): Diffuse disease in the proximal vessel up to 70%. The mid vessel is 100% occluded after the RV marginal branch.   Left ventriculography: Not done  Final Conclusions:  1. Severe 3 vessel obstructive CAD 2. Elevated LV filling pressures with large V waves and high cardiac output consistent with severe MR. 3. Moderate pulmonary HTN  Recommendations: Consider CABG/MVR  Pulmonary function tests FVC= 1.75 (46%) FEV1= 0.92(33%), 1.11 post DLCO= 20 % of predicted  Assessment/Plan: 74 yo man who presented with class IV CHF and a probable NSTEMI. He has been found to have  severe MR with posterior leaflet prolapse and severe 3 vessel CAD. He has impaired LV function. Other findings of note include acute on chronic kidney disease with creatinine now back at baseline  of 1.5. PFTs show severe COPD with an FEV1 of 0.91, although there was improvement with bronchodilators. His DLCO was even more severely impaired but his CHF may be playing a role in that.  He needs CABG, mitral repair for survival benefit and relief of symptoms.  I discussed with the patient the general nature of the procedure, the need for general anesthesia, the use of cardiopulmonary bypass, and the incisions to be used. We discussed the expected hospital stay, overall recovery and short and long term outcomes. I reviewed the indications, risks, benefits and alternatives. He understands the risks include but are not limited to death, stroke, MI, DVT/PE, bleeding, possible need for transfusion, infections, cardiac arrhythmias, heart block requiring pacemaker placement, as well as other organ system dysfunction including respiratory, renal, or GI complications. He is at high risk for respiratory and renal complications in particular.   He accepts the risks and agrees to proceed.  Plan CABG, mitral repair on Thursday 09/19/2014 HENDRICKSON,STEVEN C 08/27/2014, 5:44 PM

## 2014-08-27 DEATH — deceased

## 2014-08-28 ENCOUNTER — Inpatient Hospital Stay (HOSPITAL_COMMUNITY): Payer: Medicare Other

## 2014-08-28 LAB — CBC
HEMATOCRIT: 32.4 % — AB (ref 39.0–52.0)
Hemoglobin: 10.9 g/dL — ABNORMAL LOW (ref 13.0–17.0)
MCH: 30.2 pg (ref 26.0–34.0)
MCHC: 33.6 g/dL (ref 30.0–36.0)
MCV: 89.8 fL (ref 78.0–100.0)
Platelets: 287 10*3/uL (ref 150–400)
RBC: 3.61 MIL/uL — ABNORMAL LOW (ref 4.22–5.81)
RDW: 15.6 % — AB (ref 11.5–15.5)
WBC: 5.6 10*3/uL (ref 4.0–10.5)

## 2014-08-28 LAB — BASIC METABOLIC PANEL
Anion gap: 9 (ref 5–15)
BUN: 32 mg/dL — ABNORMAL HIGH (ref 6–23)
CALCIUM: 8.5 mg/dL (ref 8.4–10.5)
CO2: 24 mmol/L (ref 19–32)
CREATININE: 1.38 mg/dL — AB (ref 0.50–1.35)
Chloride: 104 mmol/L (ref 96–112)
GFR calc non Af Amer: 49 mL/min — ABNORMAL LOW (ref >90)
GFR, EST AFRICAN AMERICAN: 57 mL/min — AB (ref >90)
Glucose, Bld: 90 mg/dL (ref 70–99)
Potassium: 4.1 mmol/L (ref 3.5–5.1)
Sodium: 137 mmol/L (ref 135–145)

## 2014-08-28 LAB — URINALYSIS, ROUTINE W REFLEX MICROSCOPIC
Bilirubin Urine: NEGATIVE
GLUCOSE, UA: NEGATIVE mg/dL
Hgb urine dipstick: NEGATIVE
KETONES UR: NEGATIVE mg/dL
LEUKOCYTES UA: NEGATIVE
NITRITE: NEGATIVE
Protein, ur: NEGATIVE mg/dL
Specific Gravity, Urine: 1.008 (ref 1.005–1.030)
Urobilinogen, UA: 1 mg/dL (ref 0.0–1.0)
pH: 6.5 (ref 5.0–8.0)

## 2014-08-28 LAB — HEPARIN LEVEL (UNFRACTIONATED): Heparin Unfractionated: <0.1 IU/mL — ABNORMAL LOW (ref 0.30–0.70)

## 2014-08-28 LAB — SURGICAL PCR SCREEN
MRSA, PCR: NEGATIVE
Staphylococcus aureus: NEGATIVE

## 2014-08-28 LAB — APTT: APTT: 68 s — AB (ref 24–37)

## 2014-08-28 MED ORDER — SODIUM CHLORIDE 0.9 % IV SOLN
INTRAVENOUS | Status: AC
  Administered 2014-08-29: 1.2 [IU]/h via INTRAVENOUS
  Filled 2014-08-28: qty 2.5

## 2014-08-28 MED ORDER — PHENYLEPHRINE HCL 10 MG/ML IJ SOLN
30.0000 ug/min | INTRAVENOUS | Status: DC
  Filled 2014-08-28: qty 2

## 2014-08-28 MED ORDER — DEXTROSE 5 % IV SOLN
750.0000 mg | INTRAVENOUS | Status: DC
  Filled 2014-08-28: qty 750

## 2014-08-28 MED ORDER — EPINEPHRINE HCL 1 MG/ML IJ SOLN
0.0000 ug/min | INTRAVENOUS | Status: DC
  Filled 2014-08-28: qty 4

## 2014-08-28 MED ORDER — DIAZEPAM 2 MG PO TABS
2.0000 mg | ORAL_TABLET | Freq: Once | ORAL | Status: AC
  Administered 2014-08-29: 2 mg via ORAL
  Filled 2014-08-28: qty 1

## 2014-08-28 MED ORDER — ALPRAZOLAM 0.25 MG PO TABS: 0.2500 mg | ORAL_TABLET | ORAL | Status: DC | PRN

## 2014-08-28 MED ORDER — BISACODYL 5 MG PO TBEC
5.0000 mg | DELAYED_RELEASE_TABLET | Freq: Once | ORAL | Status: AC
  Administered 2014-08-28: 5 mg via ORAL
  Filled 2014-08-28: qty 1

## 2014-08-28 MED ORDER — SODIUM CHLORIDE 0.9 % IV SOLN
INTRAVENOUS | Status: AC
  Administered 2014-08-29: 14 mL/h via INTRAVENOUS
  Filled 2014-08-28: qty 40

## 2014-08-28 MED ORDER — DOPAMINE-DEXTROSE 3.2-5 MG/ML-% IV SOLN
0.0000 ug/kg/min | INTRAVENOUS | Status: DC
  Filled 2014-08-28: qty 250

## 2014-08-28 MED ORDER — CHLORHEXIDINE GLUCONATE 4 % EX LIQD
60.0000 mL | Freq: Once | CUTANEOUS | Status: AC
  Administered 2014-08-28: 4 via TOPICAL
  Filled 2014-08-28 (×2): qty 60

## 2014-08-28 MED ORDER — MAGNESIUM SULFATE 50 % IJ SOLN
40.0000 meq | INTRAMUSCULAR | Status: DC
  Filled 2014-08-28: qty 10

## 2014-08-28 MED ORDER — FUROSEMIDE 10 MG/ML IJ SOLN
40.0000 mg | Freq: Every day | INTRAMUSCULAR | Status: DC
Start: 2014-08-28 — End: 2014-08-29
  Administered 2014-08-28: 40 mg via INTRAVENOUS
  Filled 2014-08-28 (×2): qty 4

## 2014-08-28 MED ORDER — VANCOMYCIN HCL 10 G IV SOLR
1250.0000 mg | INTRAVENOUS | Status: AC
  Administered 2014-08-29: 1250 mg via INTRAVENOUS
  Filled 2014-08-28 (×2): qty 1250

## 2014-08-28 MED ORDER — PLASMA-LYTE 148 IV SOLN
INTRAVENOUS | Status: AC
  Administered 2014-08-29: 500 mL
  Filled 2014-08-28: qty 2.5

## 2014-08-28 MED ORDER — DEXMEDETOMIDINE HCL IN NACL 400 MCG/100ML IV SOLN
0.1000 ug/kg/h | INTRAVENOUS | Status: AC
  Administered 2014-08-29: 0.2 ug/kg/h via INTRAVENOUS
  Filled 2014-08-28: qty 100

## 2014-08-28 MED ORDER — METOPROLOL TARTRATE 12.5 MG HALF TABLET
12.5000 mg | ORAL_TABLET | Freq: Once | ORAL | Status: AC
  Administered 2014-08-29: 12.5 mg via ORAL
  Filled 2014-08-28: qty 1

## 2014-08-28 MED ORDER — DEXTROSE 5 % IV SOLN
1.5000 g | INTRAVENOUS | Status: AC
  Administered 2014-08-29: .75 g via INTRAVENOUS
  Administered 2014-08-29: 1.5 g via INTRAVENOUS
  Filled 2014-08-28: qty 1.5

## 2014-08-28 MED ORDER — HEPARIN SODIUM (PORCINE) 1000 UNIT/ML IJ SOLN
INTRAMUSCULAR | Status: DC
  Filled 2014-08-28: qty 30

## 2014-08-28 MED ORDER — CHLORHEXIDINE GLUCONATE 4 % EX LIQD
60.0000 mL | Freq: Once | CUTANEOUS | Status: AC
  Administered 2014-08-29: 4 via TOPICAL
  Filled 2014-08-28: qty 60

## 2014-08-28 MED ORDER — NITROGLYCERIN IN D5W 200-5 MCG/ML-% IV SOLN
2.0000 ug/min | INTRAVENOUS | Status: DC
  Filled 2014-08-28: qty 250

## 2014-08-28 MED ORDER — TEMAZEPAM 15 MG PO CAPS: 15.0000 mg | ORAL_CAPSULE | Freq: Once | ORAL | Status: AC | PRN

## 2014-08-28 MED ORDER — POTASSIUM CHLORIDE 2 MEQ/ML IV SOLN
80.0000 meq | INTRAVENOUS | Status: DC
  Filled 2014-08-28: qty 40

## 2014-08-28 NOTE — Progress Notes (Signed)
[id number] Cardiac Rehab Completed pre-op education with pt and his daughter. We discussed sternal precautions, walking and use of IS post-op. I gave them surgery education booklet and pt care guide. I encouraged them watch going for heart surgery video. I gave pt IS and instructed him in use. He was able to get IS to 600. Beatrix FettersHughes, Vondell Babers G, RN 08/28/2014 2:38 PM

## 2014-08-28 NOTE — Progress Notes (Signed)
.  Patient Profile: 89M smoker with HTN, HLD, GERD, CVA, CKD (baseline Cr 1.6), with L3 compression fracture s/p kyphoplasty on 2/10 and history of MR who due to MVP admitted 08/20/2014 with new onset CHF and NSTEMI.  Subjective: Currently CP free. No dyspnea. Awaiting CABG tomorrow.   Objective: Vital signs in last 24 hours: Temp:  [97 F (36.1 C)-97.5 F (36.4 C)] 97.2 F (36.2 C) (03/02 0646) Pulse Rate:  [69-76] 69 (03/02 0646) Resp:  [18-20] 20 (03/02 0646) BP: (90-110)/(55-77) 101/77 mmHg (03/02 0646) SpO2:  [98 %-100 %] 98 % (03/02 0646) Weight:  [134 lb 4.8 oz (60.918 kg)] 134 lb 4.8 oz (60.918 kg) (03/02 0646) Last BM Date: 2014-09-09 (pt reports having BM q6-7 days)  Intake/Output from previous day: 03/01 0701 - 03/02 0700 In: 1200 [P.O.:1200] Out: 3100 [Urine:3100] Intake/Output this shift: Total I/O In: 120 [P.O.:120] Out: 0   Medications Current Facility-Administered Medications  Medication Dose Route Frequency Provider Last Rate Last Dose  . 0.9 %  sodium chloride infusion  250 mL Intravenous PRN Glori Luis, MD 10 mL/hr at 08/24/14 1900 250 mL at 08/24/14 1900  . acetaminophen (TYLENOL) tablet 650 mg  650 mg Oral Q4H PRN Glori Luis, MD      . aspirin EC tablet 81 mg  81 mg Oral Daily Corky Crafts, MD   81 mg at 08/27/14 1041  . atorvastatin (LIPITOR) tablet 80 mg  80 mg Oral q1800 Glori Luis, MD   80 mg at 08/27/14 1706  . heparin ADULT infusion 100 units/mL (25000 units/250 mL)  1,050 Units/hr Intravenous Continuous Fredrik Rigger, RPH 10.5 mL/hr at 08/27/14 1116 1,050 Units/hr at 08/27/14 1116  . ondansetron (ZOFRAN) injection 4 mg  4 mg Intravenous Q6H PRN Glori Luis, MD      . potassium chloride SA (K-DUR,KLOR-CON) CR tablet 20 mEq  20 mEq Oral Daily Ok Anis, NP   20 mEq at 08/27/14 1041  . sodium chloride 0.9 % injection 3 mL  3 mL Intravenous Q12H Glori Luis, MD   3 mL at 08/27/14 1953  . sodium chloride 0.9 %  injection 3 mL  3 mL Intravenous PRN Glori Luis, MD        PE: General appearance: alert, cooperative and no distress Neck: no carotid bruit and no JVD Lungs: decreased BS at the bases + bilateral rhonchi Heart: regular rate and rhythm and 3/6 harsh holosystolic murmur Extremities: no edema; bilateral compression stockings Pulses: 2+ and symmetric Skin: warm and dry Neurologic: Grossly normal  Lab Results:   Recent Labs  09-09-2014 0358 08/27/14 0554 08/28/14 0555  WBC 6.5 5.9 5.6  HGB 12.1* 11.6* 10.9*  HCT 36.3* 35.1* 32.4*  PLT 323 293 287   BMET  Recent Labs  09-09-2014 0358 08/27/14 1152 08/28/14 0555  NA 132* 137 137  K 4.6 4.5 4.1  CL 105 105 104  CO2 GLUCOSE 113* 130* 90  BUN 35* 30* 32*  CREATININE 1.42* 1.52* 1.38*  CALCIUM 8.2* 8.8 8.5   PT/INR  Recent Labs  09/09/2014 0358  LABPROT 14.9  INR 1.16    Studies/Results: [facility] 09-Sep-2014 Procedural Findings: Hemodynamics RA 16/17 mean 15 mm Hg RV 54/10/16 mm Hg PA 58/24 mean 36 mm Hg PCWP 32/51 mean 32 mm Hg, very large V waves LV 134/23/37 mm Hg AO 134/76 mean 101 mm Hg  Oxygen saturations: RA 82% RV 80% PA 80% AO 99%  Cardiac Output (Fick) 8.0 L/min  Cardiac Index (Fick) 4.76 L/min/meter squared.  Coronary angiography: Coronary dominance: right  Left mainstem: Normal  Left anterior descending (LAD): 80-90% proximal. Diffuse disease in the proximal vessel.   Left circumflex (LCx): 80% proximal.  Right coronary artery (RCA): Diffuse disease in the proximal vessel up to 70%. The mid vessel is 100% occluded after the RV marginal branch.   Left ventriculography: Not done  Final Conclusions:  1. Severe 3 vessel obstructive CAD 2. Elevated LV filling pressures with large V waves and high cardiac output consistent with severe MR. 3. Moderate pulmonary HTN  Assessment/Plan  Principal Problem:   Acute combined systolic and diastolic CHF, NYHA class 3 Active  Problems:   Elevated troponin   Severe mitral regurgitation   Congestive dilated cardiomyopathy   Acute renal failure superimposed on stage 3 chronic kidney disease   Hypokalemia   Normocytic anemia   1. CAD: CP free on IV heparin. LHC revealed 3 vessel obstructive CAD with 80-90% proximal LAD stenosis, 80% proximal LCx and 70% proximal RCA with 100% mid occlusion after the RV marginal branch. Seen by Dr. Dorris FetchHendrickson yesterday. Plan is for CABG + MVR tomorrow. Continue medical therapy for now. On IV heparin.   2. Severe MR secondary to MVP: plan for MVR tomorrow.     3. Acute Systolic CHF: EF 16-10%40-45% on echo. Denies resting dyspnea. LEE improved. Continue IV Lasix. Monitor renal function. Monitor volume status post-op.   4. Renal Insufficiency: Scr improving. Down from 1.52 yesterday to 1.38 today. On IV lasix for volume overload.     LOS: 8 days    Brittainy M. Delmer IslamSimmons, PA-C 08/28/2014 9:32 AM

## 2014-08-28 NOTE — Progress Notes (Signed)
ANTICOAGULATION CONSULT NOTE - Follow Up Consult  Pharmacy Consult for Heparin Indication: severe 3v CAD  No Known Allergies  Patient Measurements: Height: 5\' 7"  (170.2 cm) Weight: 134 lb 4.8 oz (60.918 kg) IBW/kg (Calculated) : 66.1  Vital Signs: Temp: 98.4 F (36.9 C) (03/02 1019) Temp Source: Oral (03/02 1019) BP: 100/75 mmHg (03/02 1019) Pulse Rate: 72 (03/02 1019)  Labs:  Recent Labs  08/04/2014 0358 08/27/14 0554 08/27/14 1141 08/27/14 1152 08/28/14 0555 08/28/14 0940  HGB 12.1* 11.6*  --   --  10.9*  --   HCT 36.3* 35.1*  --   --  32.4*  --   PLT 323 293  --   --  287  --   LABPROT 14.9  --   --   --   --   --   INR 1.16  --   --   --   --   --   HEPARINUNFRC 0.51  --  0.34  --  <0.10* <0.10*  CREATININE 1.42*  --   --  1.52* 1.38*  --     Estimated Creatinine Clearance: 41.1 mL/min (by C-G formula based on Cr of 1.38).  Assessment: 74 year old male awaiting CVTS consult for severe 3 v disease Heparin level < 0.10 this AM  Goal of Therapy:  Heparin level 0.3-0.7 units/ml Monitor platelets by anticoagulation protocol: Yes   Plan:  Heparin drip to 1250 units / hr Follow up after surgery tomorrow  Thank you. Okey RegalLisa Tiffine Henigan, PharmD [phone]  Elwin SleightPowell, Dashiell Franchino Kay 08/28/2014,11:33 AM

## 2014-08-28 NOTE — Progress Notes (Signed)
2 Days Post-Op Procedure(s) (LRB): LEFT AND RIGHT HEART CATHETERIZATION WITH CORONARY ANGIOGRAM (N/A) Subjective: No complaints today  Objective: Vital signs in last 24 hours: Temp:  [97 F (36.1 C)-98.4 F (36.9 C)] 98.4 F (36.9 C) (03/02 1019) Pulse Rate:  [69-76] 72 (03/02 1019) Cardiac Rhythm:  [-] Sinus bradycardia (03/01 1958) Resp:  [18-20] 20 (03/02 1019) BP: (90-110)/(55-77) 100/75 mmHg (03/02 1019) SpO2:  [98 %-100 %] 100 % (03/02 1019) Weight:  [134 lb 4.8 oz (60.918 kg)] 134 lb 4.8 oz (60.918 kg) (03/02 0646)  Hemodynamic parameters for last 24 hours:    Intake/Output from previous day: 03/01 0701 - 03/02 0700 In: 1200 [P.O.:1200] Out: 3100 [Urine:3100] Intake/Output this shift: Total I/O In: 360 [P.O.:360] Out: 625 [Urine:625]  General appearance: alert and cooperative Heart: regular rate and rhythm Lungs: diminished breath sounds bibasilar  Lab Results:  Recent Labs  08/27/14 0554 08/28/14 0555  WBC 5.9 5.6  HGB 11.6* 10.9*  HCT 35.1* 32.4*  PLT 293 287   BMET:  Recent Labs  08/27/14 1152 08/28/14 0555  NA 137 137  K 4.5 4.1  CL 105 104  CO2 21 24  GLUCOSE 130* 90  BUN 30* 32*  CREATININE 1.52* 1.38*  CALCIUM 8.8 8.5    PT/INR:  Recent Labs  08/20/2014 0358  LABPROT 14.9  INR 1.16   ABG    Component Value Date/Time   PHART 7.451* 08/15/2014 1501   HCO3 19.7* 08/07/2014 1505   TCO2 21 08/18/2014 1505   ACIDBASEDEF 5.0* 07/30/2014 1505   O2SAT 82.0 08/18/2014 1505   CBG (last 3)  No results for input(s): GLUCAP in the last 72 hours.  Assessment/Plan: S/P Procedure(s) (LRB): LEFT AND RIGHT HEART CATHETERIZATION WITH CORONARY ANGIOGRAM (N/A) For CABG, mitral repair in AM   Mr. Espin continues to receive IV diuresis.   I met with Mr. Chalfin and his daughter. I reviewed the information I discussed with him yesterday. They both understand the high risk nature of the procedure given his respiratory and renal issues. His  PFT are borderline, but I suspect CHF with effusions is a component of the problem.  They understand the risks include but are not limited to death, MI, DVT, PE, stroke, bleeding, need for transfusion, infection, heart block, respiratory failure, renal failure or other unforeseeable complications.  He agrees to proceed  For OR in AM   LOS: 8 days    HENDRICKSON,STEVEN C 08/28/2014

## 2014-08-29 ENCOUNTER — Encounter (HOSPITAL_COMMUNITY): Payer: Self-pay | Admitting: Anesthesiology

## 2014-08-29 ENCOUNTER — Encounter (HOSPITAL_COMMUNITY)
Admission: EM | Disposition: E | Payer: Medicare Other | Source: Home / Self Care | Attending: Thoracic Surgery (Cardiothoracic Vascular Surgery)

## 2014-08-29 ENCOUNTER — Inpatient Hospital Stay (HOSPITAL_COMMUNITY): Payer: Medicare Other

## 2014-08-29 ENCOUNTER — Inpatient Hospital Stay (HOSPITAL_COMMUNITY): Payer: Medicare Other | Admitting: Anesthesiology

## 2014-08-29 DIAGNOSIS — Z951 Presence of aortocoronary bypass graft: Secondary | ICD-10-CM

## 2014-08-29 DIAGNOSIS — I2510 Atherosclerotic heart disease of native coronary artery without angina pectoris: Secondary | ICD-10-CM

## 2014-08-29 DIAGNOSIS — I340 Nonrheumatic mitral (valve) insufficiency: Secondary | ICD-10-CM

## 2014-08-29 HISTORY — PX: CORONARY ARTERY BYPASS GRAFT: SHX141

## 2014-08-29 HISTORY — PX: MITRAL VALVE REPAIR: SHX2039

## 2014-08-29 HISTORY — PX: TEE WITHOUT CARDIOVERSION: SHX5443

## 2014-08-29 LAB — CBC
HCT: 32.2 % — ABNORMAL LOW (ref 39.0–52.0)
HCT: 30.6 % — ABNORMAL LOW (ref 39.0–52.0)
HEMATOCRIT: 32.6 % — AB (ref 39.0–52.0)
HEMOGLOBIN: 10.6 g/dL — AB (ref 13.0–17.0)
Hemoglobin: 10.9 g/dL — ABNORMAL LOW (ref 13.0–17.0)
Hemoglobin: 11.2 g/dL — ABNORMAL LOW (ref 13.0–17.0)
MCH: 30.6 pg (ref 26.0–34.0)
MCH: 30.6 pg (ref 26.0–34.0)
MCH: 30.5 pg (ref 26.0–34.0)
MCHC: 33.4 g/dL (ref 30.0–36.0)
MCHC: 34.8 g/dL (ref 30.0–36.0)
MCHC: 34.6 g/dL (ref 30.0–36.0)
MCV: 91.6 fL (ref 78.0–100.0)
MCV: 88 fL (ref 78.0–100.0)
MCV: 88.2 fL (ref 78.0–100.0)
PLATELETS: 92 10*3/uL — AB (ref 150–400)
Platelets: 216 10*3/uL (ref 150–400)
Platelets: 96 10*3/uL — ABNORMAL LOW (ref 150–400)
RBC: 3.56 MIL/uL — ABNORMAL LOW (ref 4.22–5.81)
RBC: 3.66 MIL/uL — AB (ref 4.22–5.81)
RBC: 3.47 MIL/uL — ABNORMAL LOW (ref 4.22–5.81)
RDW: 15.6 % — ABNORMAL HIGH (ref 11.5–15.5)
RDW: 15.3 % (ref 11.5–15.5)
RDW: 15.7 % — AB (ref 11.5–15.5)
WBC: 6.4 10*3/uL (ref 4.0–10.5)
WBC: 5.6 10*3/uL (ref 4.0–10.5)
WBC: 6.8 10*3/uL (ref 4.0–10.5)

## 2014-08-29 LAB — POCT I-STAT, CHEM 8
BUN: 37 mg/dL — ABNORMAL HIGH (ref 6–23)
BUN: 27 mg/dL — AB (ref 6–23)
BUN: 31 mg/dL — ABNORMAL HIGH (ref 6–23)
BUN: 29 mg/dL — ABNORMAL HIGH (ref 6–23)
BUN: 28 mg/dL — ABNORMAL HIGH (ref 6–23)
BUN: 30 mg/dL — ABNORMAL HIGH (ref 6–23)
BUN: 31 mg/dL — ABNORMAL HIGH (ref 6–23)
BUN: 26 mg/dL — ABNORMAL HIGH (ref 6–23)
CALCIUM ION: 1.15 mmol/L (ref 1.13–1.30)
CHLORIDE: 105 mmol/L (ref 96–112)
CREATININE: 1.1 mg/dL (ref 0.50–1.35)
CREATININE: 1.1 mg/dL (ref 0.50–1.35)
Calcium, Ion: 0.98 mmol/L — ABNORMAL LOW (ref 1.13–1.30)
Calcium, Ion: 1.18 mmol/L (ref 1.13–1.30)
Calcium, Ion: 1.04 mmol/L — ABNORMAL LOW (ref 1.13–1.30)
Calcium, Ion: 0.96 mmol/L — ABNORMAL LOW (ref 1.13–1.30)
Calcium, Ion: 1.04 mmol/L — ABNORMAL LOW (ref 1.13–1.30)
Calcium, Ion: 1.09 mmol/L — ABNORMAL LOW (ref 1.13–1.30)
Calcium, Ion: 1.2 mmol/L (ref 1.13–1.30)
Chloride: 103 mmol/L (ref 96–112)
Chloride: 101 mmol/L (ref 96–112)
Chloride: 103 mmol/L (ref 96–112)
Chloride: 105 mmol/L (ref 96–112)
Chloride: 104 mmol/L (ref 96–112)
Chloride: 105 mmol/L (ref 96–112)
Chloride: 113 mmol/L — ABNORMAL HIGH (ref 96–112)
Creatinine, Ser: 1.1 mg/dL (ref 0.50–1.35)
Creatinine, Ser: 1 mg/dL (ref 0.50–1.35)
Creatinine, Ser: 1 mg/dL (ref 0.50–1.35)
Creatinine, Ser: 0.9 mg/dL (ref 0.50–1.35)
Creatinine, Ser: 1.1 mg/dL (ref 0.50–1.35)
Creatinine, Ser: 1.1 mg/dL (ref 0.50–1.35)
GLUCOSE: 115 mg/dL — AB (ref 70–99)
GLUCOSE: 96 mg/dL (ref 70–99)
Glucose, Bld: 101 mg/dL — ABNORMAL HIGH (ref 70–99)
Glucose, Bld: 110 mg/dL — ABNORMAL HIGH (ref 70–99)
Glucose, Bld: 127 mg/dL — ABNORMAL HIGH (ref 70–99)
Glucose, Bld: 101 mg/dL — ABNORMAL HIGH (ref 70–99)
Glucose, Bld: 99 mg/dL (ref 70–99)
Glucose, Bld: 156 mg/dL — ABNORMAL HIGH (ref 70–99)
HCT: 26 % — ABNORMAL LOW (ref 39.0–52.0)
HCT: 32 % — ABNORMAL LOW (ref 39.0–52.0)
HCT: 20 % — ABNORMAL LOW (ref 39.0–52.0)
HCT: 27 % — ABNORMAL LOW (ref 39.0–52.0)
HEMATOCRIT: 32 % — AB (ref 39.0–52.0)
HEMATOCRIT: 22 % — AB (ref 39.0–52.0)
HEMATOCRIT: 30 % — AB (ref 39.0–52.0)
HEMATOCRIT: 31 % — AB (ref 39.0–52.0)
HEMOGLOBIN: 10.9 g/dL — AB (ref 13.0–17.0)
HEMOGLOBIN: 10.2 g/dL — AB (ref 13.0–17.0)
HEMOGLOBIN: 10.5 g/dL — AB (ref 13.0–17.0)
Hemoglobin: 10.9 g/dL — ABNORMAL LOW (ref 13.0–17.0)
Hemoglobin: 8.8 g/dL — ABNORMAL LOW (ref 13.0–17.0)
Hemoglobin: 6.8 g/dL — CL (ref 13.0–17.0)
Hemoglobin: 7.5 g/dL — ABNORMAL LOW (ref 13.0–17.0)
Hemoglobin: 9.2 g/dL — ABNORMAL LOW (ref 13.0–17.0)
POTASSIUM: 5.4 mmol/L — AB (ref 3.5–5.1)
POTASSIUM: 4.6 mmol/L (ref 3.5–5.1)
POTASSIUM: 4.3 mmol/L (ref 3.5–5.1)
Potassium: 4.1 mmol/L (ref 3.5–5.1)
Potassium: 3.9 mmol/L (ref 3.5–5.1)
Potassium: 4.2 mmol/L (ref 3.5–5.1)
Potassium: 4.5 mmol/L (ref 3.5–5.1)
Potassium: 5.2 mmol/L — ABNORMAL HIGH (ref 3.5–5.1)
SODIUM: 137 mmol/L (ref 135–145)
SODIUM: 139 mmol/L (ref 135–145)
SODIUM: 138 mmol/L (ref 135–145)
Sodium: 137 mmol/L (ref 135–145)
Sodium: 135 mmol/L (ref 135–145)
Sodium: 134 mmol/L — ABNORMAL LOW (ref 135–145)
Sodium: 137 mmol/L (ref 135–145)
Sodium: 134 mmol/L — ABNORMAL LOW (ref 135–145)
TCO2: 24 mmol/L (ref 0–100)
TCO2: 19 mmol/L (ref 0–100)
TCO2: 21 mmol/L (ref 0–100)
TCO2: 21 mmol/L (ref 0–100)
TCO2: 22 mmol/L (ref 0–100)
TCO2: 21 mmol/L (ref 0–100)
TCO2: 22 mmol/L (ref 0–100)
TCO2: 18 mmol/L (ref 0–100)

## 2014-08-29 LAB — POCT I-STAT 3, ART BLOOD GAS (G3+)
Acid-base deficit: 4 mmol/L — ABNORMAL HIGH (ref 0.0–2.0)
Acid-base deficit: 4 mmol/L — ABNORMAL HIGH (ref 0.0–2.0)
Acid-base deficit: 2 mmol/L (ref 0.0–2.0)
BICARBONATE: 23 meq/L (ref 20.0–24.0)
BICARBONATE: 22.4 meq/L (ref 20.0–24.0)
Bicarbonate: 22.5 mEq/L (ref 20.0–24.0)
O2 Saturation: 100 %
O2 Saturation: 100 %
O2 Saturation: 100 %
PCO2 ART: 37.1 mmHg (ref 35.0–45.0)
PH ART: 7.272 — AB (ref 7.350–7.450)
PO2 ART: 380 mmHg — AB (ref 80.0–100.0)
PO2 ART: 207 mmHg — AB (ref 80.0–100.0)
Patient temperature: 36.6
TCO2: 24 mmol/L (ref 0–100)
TCO2: 25 mmol/L (ref 0–100)
TCO2: 23 mmol/L (ref 0–100)
pCO2 arterial: 44.4 mmHg (ref 35.0–45.0)
pCO2 arterial: 49.9 mmHg — ABNORMAL HIGH (ref 35.0–45.0)
pH, Arterial: 7.305 — ABNORMAL LOW (ref 7.350–7.450)
pH, Arterial: 7.386 (ref 7.350–7.450)
pO2, Arterial: 383 mmHg — ABNORMAL HIGH (ref 80.0–100.0)

## 2014-08-29 LAB — POCT I-STAT 4, (NA,K, GLUC, HGB,HCT)
GLUCOSE: 92 mg/dL (ref 70–99)
HEMATOCRIT: 31 % — AB (ref 39.0–52.0)
HEMOGLOBIN: 10.5 g/dL — AB (ref 13.0–17.0)
Potassium: 4.1 mmol/L (ref 3.5–5.1)
Sodium: 138 mmol/L (ref 135–145)

## 2014-08-29 LAB — BASIC METABOLIC PANEL
Anion gap: 9 (ref 5–15)
BUN: 35 mg/dL — ABNORMAL HIGH (ref 6–23)
CO2: 25 mmol/L (ref 19–32)
Calcium: 8.4 mg/dL (ref 8.4–10.5)
Chloride: 102 mmol/L (ref 96–112)
Creatinine, Ser: 1.45 mg/dL — ABNORMAL HIGH (ref 0.50–1.35)
GFR calc Af Amer: 54 mL/min — ABNORMAL LOW (ref >90)
GFR calc non Af Amer: 46 mL/min — ABNORMAL LOW (ref >90)
GLUCOSE: 90 mg/dL (ref 70–99)
POTASSIUM: 4.4 mmol/L (ref 3.5–5.1)
SODIUM: 136 mmol/L (ref 135–145)

## 2014-08-29 LAB — PROTIME-INR
INR: 1.5 — ABNORMAL HIGH (ref 0.00–1.49)
Prothrombin Time: 18.2 seconds — ABNORMAL HIGH (ref 11.6–15.2)

## 2014-08-29 LAB — PREPARE RBC (CROSSMATCH)

## 2014-08-29 LAB — HEMOGLOBIN AND HEMATOCRIT, BLOOD
HCT: 22.6 % — ABNORMAL LOW (ref 39.0–52.0)
Hemoglobin: 7.7 g/dL — ABNORMAL LOW (ref 13.0–17.0)

## 2014-08-29 LAB — APTT: aPTT: 37 seconds (ref 24–37)

## 2014-08-29 LAB — HEPARIN LEVEL (UNFRACTIONATED): Heparin Unfractionated: 0.3 IU/mL (ref 0.30–0.70)

## 2014-08-29 LAB — GLUCOSE, CAPILLARY
GLUCOSE-CAPILLARY: 126 mg/dL — AB (ref 70–99)
Glucose-Capillary: 97 mg/dL (ref 70–99)
Glucose-Capillary: 106 mg/dL — ABNORMAL HIGH (ref 70–99)

## 2014-08-29 LAB — PLATELET COUNT: Platelets: 55 10*3/uL — ABNORMAL LOW (ref 150–400)

## 2014-08-29 LAB — CREATININE, SERUM
CREATININE: 1.36 mg/dL — AB (ref 0.50–1.35)
GFR calc non Af Amer: 50 mL/min — ABNORMAL LOW (ref >90)
GFR, EST AFRICAN AMERICAN: 58 mL/min — AB (ref >90)

## 2014-08-29 LAB — MAGNESIUM: Magnesium: 3.5 mg/dL — ABNORMAL HIGH (ref 1.5–2.5)

## 2014-08-29 SURGERY — CORONARY ARTERY BYPASS GRAFTING (CABG)
Anesthesia: General | Site: Chest

## 2014-08-29 MED ORDER — SODIUM CHLORIDE 0.9 % IV SOLN
INTRAVENOUS | Status: DC
  Filled 2014-08-29: qty 2.5

## 2014-08-29 MED ORDER — SODIUM CHLORIDE 0.9 % IJ SOLN: 3.0000 mL | INTRAMUSCULAR | Status: DC | PRN

## 2014-08-29 MED ORDER — PANTOPRAZOLE SODIUM 40 MG PO TBEC
40.0000 mg | DELAYED_RELEASE_TABLET | Freq: Every day | ORAL | Status: DC
  Administered 2014-08-31 – 2014-09-12 (×13): 40 mg via ORAL
  Filled 2014-08-29 (×13): qty 1

## 2014-08-29 MED ORDER — SODIUM CHLORIDE 0.9 % IV SOLN
INTRAVENOUS | Status: DC | PRN
  Administered 2014-08-29: 14:00:00 via INTRAVENOUS

## 2014-08-29 MED ORDER — OXYCODONE HCL 5 MG PO TABS
5.0000 mg | ORAL_TABLET | ORAL | Status: DC | PRN
  Administered 2014-08-30: 10 mg via ORAL
  Administered 2014-08-30 – 2014-08-31 (×3): 5 mg via ORAL
  Filled 2014-08-29 (×2): qty 1
  Filled 2014-08-29: qty 2
  Filled 2014-08-29: qty 1

## 2014-08-29 MED ORDER — ARTIFICIAL TEARS OP OINT
TOPICAL_OINTMENT | OPHTHALMIC | Status: DC | PRN
  Administered 2014-08-29: 1 via OPHTHALMIC

## 2014-08-29 MED ORDER — AMINOCAPROIC ACID 250 MG/ML IV SOLN
INTRAVENOUS | Status: DC | PRN
  Administered 2014-08-29: 5 g via INTRAVENOUS

## 2014-08-29 MED ORDER — SODIUM CHLORIDE 0.9 % IV SOLN
INTRAVENOUS | Status: DC | PRN
  Administered 2014-08-29: 07:00:00 via INTRAVENOUS

## 2014-08-29 MED ORDER — FENTANYL CITRATE 0.05 MG/ML IJ SOLN
INTRAMUSCULAR | Status: AC
  Filled 2014-08-29: qty 5

## 2014-08-29 MED ORDER — STERILE WATER FOR INJECTION IJ SOLN
INTRAMUSCULAR | Status: AC
  Filled 2014-08-29: qty 10

## 2014-08-29 MED ORDER — ETOMIDATE 2 MG/ML IV SOLN
INTRAVENOUS | Status: DC | PRN
  Administered 2014-08-29: 14 mg via INTRAVENOUS

## 2014-08-29 MED ORDER — ROCURONIUM BROMIDE 100 MG/10ML IV SOLN
INTRAVENOUS | Status: DC | PRN
  Administered 2014-08-29 (×2): 50 mg via INTRAVENOUS

## 2014-08-29 MED ORDER — ASPIRIN EC 325 MG PO TBEC
325.0000 mg | DELAYED_RELEASE_TABLET | Freq: Every day | ORAL | Status: DC
  Administered 2014-08-30 – 2014-09-09 (×11): 325 mg via ORAL
  Filled 2014-08-29 (×12): qty 1

## 2014-08-29 MED ORDER — CEFUROXIME SODIUM 1.5 G IJ SOLR
1.5000 g | Freq: Two times a day (BID) | INTRAMUSCULAR | Status: AC
  Administered 2014-08-29 – 2014-08-31 (×4): 1.5 g via INTRAVENOUS
  Filled 2014-08-29 (×4): qty 1.5

## 2014-08-29 MED ORDER — DOCUSATE SODIUM 100 MG PO CAPS
200.0000 mg | ORAL_CAPSULE | Freq: Every day | ORAL | Status: DC
  Administered 2014-08-30 – 2014-09-10 (×10): 200 mg via ORAL
  Filled 2014-08-29 (×10): qty 2

## 2014-08-29 MED ORDER — PHENYLEPHRINE HCL 10 MG/ML IJ SOLN
0.0000 ug/min | INTRAVENOUS | Status: DC
  Filled 2014-08-29 (×2): qty 2

## 2014-08-29 MED ORDER — MAGNESIUM SULFATE 4 GM/100ML IV SOLN
4.0000 g | Freq: Once | INTRAVENOUS | Status: AC
  Administered 2014-08-29: 4 g via INTRAVENOUS
  Filled 2014-08-29: qty 100

## 2014-08-29 MED ORDER — DEXMEDETOMIDINE HCL IN NACL 200 MCG/50ML IV SOLN: 0.0000 ug/kg/h | INTRAVENOUS | Status: DC

## 2014-08-29 MED ORDER — HEMOSTATIC AGENTS (NO CHARGE) OPTIME
TOPICAL | Status: DC | PRN
  Administered 2014-08-29: 1 via TOPICAL

## 2014-08-29 MED ORDER — LACTATED RINGERS IV SOLN: INTRAVENOUS | Status: DC

## 2014-08-29 MED ORDER — PROTAMINE SULFATE 10 MG/ML IV SOLN
INTRAVENOUS | Status: AC
  Filled 2014-08-29: qty 25

## 2014-08-29 MED ORDER — PHENYLEPHRINE HCL 10 MG/ML IJ SOLN
10.0000 mg | INTRAVENOUS | Status: DC | PRN
  Administered 2014-08-29: 40 ug/min via INTRAVENOUS

## 2014-08-29 MED ORDER — LACTATED RINGERS IV SOLN
500.0000 mL | Freq: Once | INTRAVENOUS | Status: AC | PRN
Start: 2014-08-29 — End: 2014-08-29

## 2014-08-29 MED ORDER — MIDAZOLAM HCL 2 MG/2ML IJ SOLN: 2.0000 mg | INTRAMUSCULAR | Status: DC | PRN

## 2014-08-29 MED ORDER — VECURONIUM BROMIDE 10 MG IV SOLR
INTRAVENOUS | Status: DC | PRN
  Administered 2014-08-29: 2 mg via INTRAVENOUS
  Administered 2014-08-29: 3 mg via INTRAVENOUS
  Administered 2014-08-29: 5 mg via INTRAVENOUS

## 2014-08-29 MED ORDER — SODIUM CHLORIDE 0.9 % IJ SOLN
3.0000 mL | Freq: Two times a day (BID) | INTRAMUSCULAR | Status: DC
  Administered 2014-08-30 – 2014-09-06 (×8): 3 mL via INTRAVENOUS

## 2014-08-29 MED ORDER — MILRINONE IN DEXTROSE 20 MG/100ML IV SOLN
0.1250 ug/kg/min | INTRAVENOUS | Status: DC
  Administered 2014-08-31: 0.2 ug/kg/min via INTRAVENOUS
  Filled 2014-08-29 (×2): qty 100

## 2014-08-29 MED ORDER — ACETAMINOPHEN 650 MG RE SUPP
650.0000 mg | Freq: Once | RECTAL | Status: AC
  Administered 2014-08-29: 650 mg via RECTAL

## 2014-08-29 MED ORDER — INSULIN ASPART 100 UNIT/ML ~~LOC~~ SOLN
0.0000 [IU] | SUBCUTANEOUS | Status: DC
  Administered 2014-08-29: 2 [IU] via SUBCUTANEOUS
  Administered 2014-08-30: 4 [IU] via SUBCUTANEOUS
  Administered 2014-08-30: 2 [IU] via SUBCUTANEOUS

## 2014-08-29 MED ORDER — PROPOFOL 10 MG/ML IV BOLUS
INTRAVENOUS | Status: AC
  Filled 2014-08-29: qty 20

## 2014-08-29 MED ORDER — MIDAZOLAM HCL 10 MG/2ML IJ SOLN
INTRAMUSCULAR | Status: AC
  Filled 2014-08-29: qty 2

## 2014-08-29 MED ORDER — ROCURONIUM BROMIDE 50 MG/5ML IV SOLN
INTRAVENOUS | Status: AC
  Filled 2014-08-29: qty 1

## 2014-08-29 MED ORDER — CALCIUM CHLORIDE 10 % IV SOLN
INTRAVENOUS | Status: DC | PRN
  Administered 2014-08-29: 100 mg via INTRAVENOUS
  Administered 2014-08-29: 200 mg via INTRAVENOUS
  Administered 2014-08-29: 100 mg via INTRAVENOUS

## 2014-08-29 MED ORDER — CALCIUM CHLORIDE 10 % IV SOLN
1.0000 g | Freq: Once | INTRAVENOUS | Status: AC
  Administered 2014-08-29: 1 g via INTRAVENOUS

## 2014-08-29 MED ORDER — TRAMADOL HCL 50 MG PO TABS
50.0000 mg | ORAL_TABLET | ORAL | Status: DC | PRN
  Administered 2014-08-30 – 2014-09-03 (×2): 50 mg via ORAL
  Administered 2014-09-04: 100 mg via ORAL
  Administered 2014-09-05: 50 mg via ORAL
  Administered 2014-09-06 – 2014-09-07 (×2): 100 mg via ORAL
  Administered 2014-09-07: 50 mg via ORAL
  Administered 2014-09-08: 100 mg via ORAL
  Filled 2014-08-29: qty 2
  Filled 2014-08-29: qty 1
  Filled 2014-08-29: qty 2
  Filled 2014-08-29 (×3): qty 1
  Filled 2014-08-29 (×2): qty 2

## 2014-08-29 MED ORDER — DOPAMINE-DEXTROSE 3.2-5 MG/ML-% IV SOLN
5.0000 ug/kg/min | INTRAVENOUS | Status: DC
  Administered 2014-08-31 – 2014-09-02 (×2): 5 ug/kg/min via INTRAVENOUS
  Filled 2014-08-29 (×2): qty 250

## 2014-08-29 MED ORDER — SODIUM CHLORIDE 0.9 % IV SOLN: INTRAVENOUS | Status: DC

## 2014-08-29 MED ORDER — NITROGLYCERIN IN D5W 200-5 MCG/ML-% IV SOLN: 0.0000 ug/min | INTRAVENOUS | Status: DC

## 2014-08-29 MED ORDER — ASPIRIN 81 MG PO CHEW: 324.0000 mg | CHEWABLE_TABLET | Freq: Every day | ORAL | Status: DC

## 2014-08-29 MED ORDER — MILRINONE IN DEXTROSE 20 MG/100ML IV SOLN
INTRAVENOUS | Status: DC | PRN
  Administered 2014-08-29: .3 ug/kg/min via INTRAVENOUS

## 2014-08-29 MED ORDER — METOPROLOL TARTRATE 1 MG/ML IV SOLN: 2.5000 mg | INTRAVENOUS | Status: DC | PRN

## 2014-08-29 MED ORDER — MILRINONE IN DEXTROSE 20 MG/100ML IV SOLN
0.2500 ug/kg/min | INTRAVENOUS | Status: DC
  Filled 2014-08-29: qty 100

## 2014-08-29 MED ORDER — ALBUMIN HUMAN 5 % IV SOLN
250.0000 mL | INTRAVENOUS | Status: AC | PRN
  Administered 2014-08-29 (×2): 250 mL via INTRAVENOUS
  Filled 2014-08-29: qty 250

## 2014-08-29 MED ORDER — BISACODYL 5 MG PO TBEC
10.0000 mg | DELAYED_RELEASE_TABLET | Freq: Every day | ORAL | Status: DC
  Administered 2014-08-30 – 2014-09-10 (×9): 10 mg via ORAL
  Filled 2014-08-29 (×9): qty 2

## 2014-08-29 MED ORDER — DOPAMINE-DEXTROSE 1.6-5 MG/ML-% IV SOLN
INTRAVENOUS | Status: DC | PRN
  Administered 2014-08-29: 3 ug/kg/min via INTRAVENOUS

## 2014-08-29 MED ORDER — VECURONIUM BROMIDE 10 MG IV SOLR
INTRAVENOUS | Status: AC
  Filled 2014-08-29: qty 10

## 2014-08-29 MED ORDER — MORPHINE SULFATE 2 MG/ML IJ SOLN
2.0000 mg | INTRAMUSCULAR | Status: DC | PRN
  Administered 2014-08-30: 2 mg via INTRAVENOUS
  Filled 2014-08-29: qty 1

## 2014-08-29 MED ORDER — SODIUM CHLORIDE 0.45 % IV SOLN
INTRAVENOUS | Status: DC | PRN
  Administered 2014-08-30: 22:00:00 via INTRAVENOUS

## 2014-08-29 MED ORDER — SODIUM CHLORIDE 0.9 % IV SOLN: 1.0000 g | Freq: Once | INTRAVENOUS | Status: DC

## 2014-08-29 MED ORDER — PROTAMINE SULFATE 10 MG/ML IV SOLN
INTRAVENOUS | Status: DC | PRN
  Administered 2014-08-29: 10 mg via INTRAVENOUS
  Administered 2014-08-29: 170 mg via INTRAVENOUS

## 2014-08-29 MED ORDER — METOPROLOL TARTRATE 25 MG/10 ML ORAL SUSPENSION
12.5000 mg | Freq: Two times a day (BID) | ORAL | Status: DC
  Filled 2014-08-29 (×15): qty 5

## 2014-08-29 MED ORDER — MORPHINE SULFATE 2 MG/ML IJ SOLN
1.0000 mg | INTRAMUSCULAR | Status: AC | PRN
  Filled 2014-08-29: qty 1

## 2014-08-29 MED ORDER — FAMOTIDINE IN NACL 20-0.9 MG/50ML-% IV SOLN
20.0000 mg | Freq: Two times a day (BID) | INTRAVENOUS | Status: AC
  Administered 2014-08-29: 20 mg via INTRAVENOUS

## 2014-08-29 MED ORDER — MIDAZOLAM HCL 5 MG/5ML IJ SOLN
INTRAMUSCULAR | Status: DC | PRN
  Administered 2014-08-29: 2 mg via INTRAVENOUS
  Administered 2014-08-29: 1 mg via INTRAVENOUS
  Administered 2014-08-29: 3 mg via INTRAVENOUS

## 2014-08-29 MED ORDER — SODIUM CHLORIDE 0.9 % IJ SOLN
OROMUCOSAL | Status: DC | PRN
  Administered 2014-08-29 (×3): 4 mL via TOPICAL

## 2014-08-29 MED ORDER — HEPARIN SODIUM (PORCINE) 1000 UNIT/ML IJ SOLN
INTRAMUSCULAR | Status: AC
  Filled 2014-08-29: qty 1

## 2014-08-29 MED ORDER — ACETAMINOPHEN 160 MG/5ML PO SOLN: 650.0000 mg | Freq: Once | ORAL | Status: AC

## 2014-08-29 MED ORDER — HEPARIN SODIUM (PORCINE) 1000 UNIT/ML IJ SOLN
INTRAMUSCULAR | Status: DC | PRN
  Administered 2014-08-29: 22000 [IU] via INTRAVENOUS
  Administered 2014-08-29: 2000 [IU] via INTRAVENOUS

## 2014-08-29 MED ORDER — METOPROLOL TARTRATE 12.5 MG HALF TABLET
12.5000 mg | ORAL_TABLET | Freq: Two times a day (BID) | ORAL | Status: DC
  Filled 2014-08-29 (×15): qty 1

## 2014-08-29 MED ORDER — CHLORHEXIDINE GLUCONATE 0.12 % MT SOLN
15.0000 mL | Freq: Two times a day (BID) | OROMUCOSAL | Status: DC
  Administered 2014-08-29 – 2014-08-31 (×4): 15 mL via OROMUCOSAL
  Filled 2014-08-29 (×4): qty 15

## 2014-08-29 MED ORDER — INSULIN REGULAR BOLUS VIA INFUSION
0.0000 [IU] | Freq: Three times a day (TID) | INTRAVENOUS | Status: DC
  Filled 2014-08-29: qty 10

## 2014-08-29 MED ORDER — ACETAMINOPHEN 500 MG PO TABS
1000.0000 mg | ORAL_TABLET | Freq: Four times a day (QID) | ORAL | Status: AC
  Administered 2014-08-30 – 2014-09-03 (×16): 1000 mg via ORAL
  Filled 2014-08-29 (×20): qty 2

## 2014-08-29 MED ORDER — LIDOCAINE HCL (CARDIAC) 20 MG/ML IV SOLN
INTRAVENOUS | Status: DC | PRN
  Administered 2014-08-29: 60 mg via INTRAVENOUS

## 2014-08-29 MED ORDER — SODIUM CHLORIDE 0.9 % IV SOLN
1.0000 g/h | Freq: Once | INTRAVENOUS | Status: AC
  Administered 2014-08-29: 1 g/h via INTRAVENOUS
  Filled 2014-08-29: qty 20

## 2014-08-29 MED ORDER — VANCOMYCIN HCL IN DEXTROSE 1-5 GM/200ML-% IV SOLN
1000.0000 mg | Freq: Once | INTRAVENOUS | Status: AC
  Administered 2014-08-29: 1000 mg via INTRAVENOUS
  Filled 2014-08-29: qty 200

## 2014-08-29 MED ORDER — ACETAMINOPHEN 160 MG/5ML PO SOLN: 1000.0000 mg | Freq: Four times a day (QID) | ORAL | Status: AC

## 2014-08-29 MED ORDER — BISACODYL 10 MG RE SUPP: 10.0000 mg | Freq: Every day | RECTAL | Status: DC

## 2014-08-29 MED ORDER — POTASSIUM CHLORIDE 10 MEQ/50ML IV SOLN: 10.0000 meq | INTRAVENOUS | Status: AC

## 2014-08-29 MED ORDER — FENTANYL CITRATE 0.05 MG/ML IJ SOLN
INTRAMUSCULAR | Status: DC | PRN
  Administered 2014-08-29 (×3): 50 ug via INTRAVENOUS

## 2014-08-29 MED ORDER — CETYLPYRIDINIUM CHLORIDE 0.05 % MT LIQD
7.0000 mL | Freq: Four times a day (QID) | OROMUCOSAL | Status: DC
  Administered 2014-08-30 – 2014-08-31 (×5): 7 mL via OROMUCOSAL

## 2014-08-29 MED ORDER — ONDANSETRON HCL 4 MG/2ML IJ SOLN
4.0000 mg | Freq: Four times a day (QID) | INTRAMUSCULAR | Status: DC | PRN
Start: 2014-08-29 — End: 2014-09-13

## 2014-08-29 MED ORDER — 0.9 % SODIUM CHLORIDE (POUR BTL) OPTIME
TOPICAL | Status: DC | PRN
  Administered 2014-08-29: 6000 mL

## 2014-08-29 MED ORDER — LIDOCAINE HCL (CARDIAC) 20 MG/ML IV SOLN
INTRAVENOUS | Status: AC
  Filled 2014-08-29: qty 5

## 2014-08-29 MED ORDER — SODIUM CHLORIDE 0.9 % IV SOLN: 250.0000 mL | INTRAVENOUS | Status: DC

## 2014-08-29 MED FILL — Electrolyte-R (PH 7.4) Solution: INTRAVENOUS | Qty: 3000 | Status: AC

## 2014-08-29 MED FILL — Sodium Bicarbonate IV Soln 8.4%: INTRAVENOUS | Qty: 50 | Status: AC

## 2014-08-29 MED FILL — Sodium Chloride IV Soln 0.9%: INTRAVENOUS | Qty: 4000 | Status: AC

## 2014-08-29 MED FILL — Heparin Sodium (Porcine) Inj 1000 Unit/ML: INTRAMUSCULAR | Qty: 10 | Status: AC

## 2014-08-29 MED FILL — Lidocaine HCl IV Inj 20 MG/ML: INTRAVENOUS | Qty: 5 | Status: AC

## 2014-08-29 MED FILL — Mannitol IV Soln 20%: INTRAVENOUS | Qty: 500 | Status: AC

## 2014-08-29 SURGICAL SUPPLY — 133 items
ADAPTER CARDIO PERF ANTE/RETRO (ADAPTER) ×4 IMPLANT
ATTRACTOMAT 16X20 MAGNETIC DRP (DRAPES) ×4 IMPLANT
BAG DECANTER FOR FLEXI CONT (MISCELLANEOUS) ×4 IMPLANT
BANDAGE ELASTIC 4 VELCRO ST LF (GAUZE/BANDAGES/DRESSINGS) ×4 IMPLANT
BANDAGE ELASTIC 6 VELCRO ST LF (GAUZE/BANDAGES/DRESSINGS) ×4 IMPLANT
BASKET HEART  (ORDER IN 25'S) (MISCELLANEOUS) ×1
BASKET HEART (ORDER IN 25'S) (MISCELLANEOUS) ×1
BASKET HEART (ORDER IN 25S) (MISCELLANEOUS) ×2 IMPLANT
BLADE STERNUM SYSTEM 6 (BLADE) ×4 IMPLANT
BLADE SURG 15 STRL LF DISP TIS (BLADE) ×4 IMPLANT
BLADE SURG 15 STRL SS (BLADE) ×4
BNDG GAUZE ELAST 4 BULKY (GAUZE/BANDAGES/DRESSINGS) ×4 IMPLANT
CANISTER SUCTION 2500CC (MISCELLANEOUS) ×4 IMPLANT
CANN PRFSN 3/8XRT ANG TPR 14 (MISCELLANEOUS) ×2
CANNULA ARTERIAL NVNT 3/8 22FR (MISCELLANEOUS) IMPLANT
CANNULA EZ GLIDE AORTIC 21FR (CANNULA) ×4 IMPLANT
CANNULA GUNDRY RCSP 15FR (MISCELLANEOUS) IMPLANT
CANNULA PRFSN 3/8XRT ANG TPR14 (MISCELLANEOUS) ×2 IMPLANT
CANNULA VEN 1 STAGE STR 66236 (MISCELLANEOUS) IMPLANT
CANNULA VEN MTL TIP RT (MISCELLANEOUS) ×2
CANNULA VRC MALB SNGL STG 36FR (MISCELLANEOUS) ×2 IMPLANT
CARDIAC SUCTION (MISCELLANEOUS) ×4 IMPLANT
CATH CPB KIT HENDRICKSON (MISCELLANEOUS) ×4 IMPLANT
CATH ROBINSON RED A/P 18FR (CATHETERS) ×8 IMPLANT
CATH THORACIC 36FR (CATHETERS) ×4 IMPLANT
CATH THORACIC 36FR RT ANG (CATHETERS) ×4 IMPLANT
CLIP FOGARTY SPRING 6M (CLIP) ×4 IMPLANT
CLIP TI MEDIUM 24 (CLIP) IMPLANT
CLIP TI WIDE RED SMALL 24 (CLIP) ×24 IMPLANT
CONN 1/2X1/2X1/2  BEN (MISCELLANEOUS) ×2
CONN 1/2X1/2X1/2 BEN (MISCELLANEOUS) ×2 IMPLANT
CONN 3/8X1/2 ST GISH (MISCELLANEOUS) ×8 IMPLANT
CONN ST 1/2X1/2  BEN (MISCELLANEOUS) ×4
CONN ST 1/2X1/2 BEN (MISCELLANEOUS) ×4 IMPLANT
CONN Y 3/8X3/8X3/8  BEN (MISCELLANEOUS)
CONN Y 3/8X3/8X3/8 BEN (MISCELLANEOUS) IMPLANT
CONT SPEC 4OZ CLIKSEAL STRL BL (MISCELLANEOUS) ×4 IMPLANT
COVER SURGICAL LIGHT HANDLE (MISCELLANEOUS) ×8 IMPLANT
CRADLE DONUT ADULT HEAD (MISCELLANEOUS) ×4 IMPLANT
DRAIN CHANNEL 28F RND 3/8 FF (WOUND CARE) ×4 IMPLANT
DRAPE CARDIOVASCULAR INCISE (DRAPES) ×2
DRAPE SLUSH/WARMER DISC (DRAPES) ×4 IMPLANT
DRAPE SRG 135X102X78XABS (DRAPES) ×2 IMPLANT
DRSG COVADERM 4X14 (GAUZE/BANDAGES/DRESSINGS) ×4 IMPLANT
ELECT CAUTERY BLADE 6.4 (BLADE) IMPLANT
ELECT REM PT RETURN 9FT ADLT (ELECTROSURGICAL) ×8
ELECTRODE REM PT RTRN 9FT ADLT (ELECTROSURGICAL) ×4 IMPLANT
GAUZE SPONGE 4X4 12PLY STRL (GAUZE/BANDAGES/DRESSINGS) ×8 IMPLANT
GLOVE BIOGEL PI IND STRL 7.0 (GLOVE) ×20 IMPLANT
GLOVE BIOGEL PI INDICATOR 7.0 (GLOVE) ×20
GLOVE SURG SIGNA 7.5 PF LTX (GLOVE) ×12 IMPLANT
GOWN STRL REUS W/ TWL LRG LVL3 (GOWN DISPOSABLE) ×20 IMPLANT
GOWN STRL REUS W/ TWL XL LVL3 (GOWN DISPOSABLE) ×8 IMPLANT
GOWN STRL REUS W/TWL LRG LVL3 (GOWN DISPOSABLE) ×20
GOWN STRL REUS W/TWL XL LVL3 (GOWN DISPOSABLE) ×8
HEMOSTAT POWDER SURGIFOAM 1G (HEMOSTASIS) ×12 IMPLANT
HEMOSTAT SURGICEL 2X14 (HEMOSTASIS) ×4 IMPLANT
INSERT FOGARTY XLG (MISCELLANEOUS) IMPLANT
KIT BASIN OR (CUSTOM PROCEDURE TRAY) ×4 IMPLANT
KIT DRAINAGE VACCUM ASSIST (KITS) ×4 IMPLANT
KIT ROOM TURNOVER OR (KITS) ×4 IMPLANT
KIT SUCTION CATH 14FR (SUCTIONS) ×8 IMPLANT
KIT VASOVIEW W/TROCAR VH 2000 (KITS) ×4 IMPLANT
LINE VENT (MISCELLANEOUS) ×4 IMPLANT
LOOP VESSEL SUPERMAXI WHITE (MISCELLANEOUS) ×4 IMPLANT
MARKER GRAFT CORONARY BYPASS (MISCELLANEOUS) ×12 IMPLANT
NEEDLE 27GX1/2 REG BEVEL ECLIP (NEEDLE) ×4 IMPLANT
NS IRRIG 1000ML POUR BTL (IV SOLUTION) ×20 IMPLANT
PACK OPEN HEART (CUSTOM PROCEDURE TRAY) ×4 IMPLANT
PAD ARMBOARD 7.5X6 YLW CONV (MISCELLANEOUS) ×8 IMPLANT
PAD ELECT DEFIB RADIOL ZOLL (MISCELLANEOUS) ×4 IMPLANT
PENCIL BUTTON HOLSTER BLD 10FT (ELECTRODE) ×4 IMPLANT
PUNCH AORTIC ROTATE  4.5MM 8IN (MISCELLANEOUS) ×4 IMPLANT
PUNCH AORTIC ROTATE 4.0MM (MISCELLANEOUS) IMPLANT
PUNCH AORTIC ROTATE 4.5MM 8IN (MISCELLANEOUS) IMPLANT
PUNCH AORTIC ROTATE 5MM 8IN (MISCELLANEOUS) IMPLANT
RING MITRAL MEMO 3D 32MM SMD32 (Prosthesis & Implant Heart) ×4 IMPLANT
SET CARDIOPLEGIA MPS 5001102 (MISCELLANEOUS) ×4 IMPLANT
STOPCOCK 4 WAY LG BORE MALE ST (IV SETS) ×4 IMPLANT
SUCKER WEIGHTED FLEX (MISCELLANEOUS) ×4 IMPLANT
SUT BONE WAX W31G (SUTURE) ×4 IMPLANT
SUT ETHIBOND 2 0 SH (SUTURE) ×8 IMPLANT
SUT ETHIBOND 2 0 SH 36X2 (SUTURE) ×4 IMPLANT
SUT ETHIBOND 2 0 V4 (SUTURE) ×8 IMPLANT
SUT ETHIBOND 2 0V4 GREEN (SUTURE) ×8 IMPLANT
SUT ETHIBOND 4 0 RB 1 (SUTURE) ×4 IMPLANT
SUT MNCRL AB 4-0 PS2 18 (SUTURE) IMPLANT
SUT PROLENE 3 0 SH DA (SUTURE) ×4 IMPLANT
SUT PROLENE 3 0 SH1 36 (SUTURE) ×4 IMPLANT
SUT PROLENE 4 0 RB 1 (SUTURE) ×4
SUT PROLENE 4 0 SH DA (SUTURE) ×20 IMPLANT
SUT PROLENE 4-0 RB1 .5 CRCL 36 (SUTURE) ×4 IMPLANT
SUT PROLENE 5 0 C 1 36 (SUTURE) ×12 IMPLANT
SUT PROLENE 5 0 CC1 (SUTURE) ×4 IMPLANT
SUT PROLENE 6 0 C 1 30 (SUTURE) ×20 IMPLANT
SUT PROLENE 7 0 BV 1 (SUTURE) ×4 IMPLANT
SUT PROLENE 7 0 BV1 MDA (SUTURE) ×8 IMPLANT
SUT PROLENE 8 0 BV175 6 (SUTURE) IMPLANT
SUT SILK  1 MH (SUTURE) ×6
SUT SILK 1 MH (SUTURE) ×6 IMPLANT
SUT SILK 1 TIES 10X30 (SUTURE) ×4 IMPLANT
SUT SILK 2 0 (SUTURE) ×2
SUT SILK 2 0 SH CR/8 (SUTURE) ×8 IMPLANT
SUT SILK 2-0 18XBRD TIE 12 (SUTURE) ×2 IMPLANT
SUT SILK 3 0 SH CR/8 (SUTURE) ×4 IMPLANT
SUT SILK 4 0 (SUTURE) ×2
SUT SILK 4-0 18XBRD TIE 12 (SUTURE) ×2 IMPLANT
SUT STEEL 6MS V (SUTURE) ×4 IMPLANT
SUT STEEL STERNAL CCS#1 18IN (SUTURE) IMPLANT
SUT STEEL SZ 6 DBL 3X14 BALL (SUTURE) ×4 IMPLANT
SUT TEM PAC WIRE 2 0 SH (SUTURE) ×16 IMPLANT
SUT VIC AB 1 CTX 36 (SUTURE) ×4
SUT VIC AB 1 CTX36XBRD ANBCTR (SUTURE) ×4 IMPLANT
SUT VIC AB 2-0 CT1 27 (SUTURE) ×2
SUT VIC AB 2-0 CT1 TAPERPNT 27 (SUTURE) ×2 IMPLANT
SUT VIC AB 2-0 CTX 27 (SUTURE) IMPLANT
SUT VIC AB 3-0 SH 27 (SUTURE)
SUT VIC AB 3-0 SH 27X BRD (SUTURE) IMPLANT
SUT VIC AB 3-0 X1 27 (SUTURE) ×4 IMPLANT
SUT VICRYL 4-0 PS2 18IN ABS (SUTURE) IMPLANT
SUTURE E-PAK OPEN HEART (SUTURE) ×4 IMPLANT
SYSTEM SAHARA CHEST DRAIN ATS (WOUND CARE) ×4 IMPLANT
TAPE CLOTH SURG 4X10 WHT LF (GAUZE/BANDAGES/DRESSINGS) ×4 IMPLANT
TOWEL OR 17X24 6PK STRL BLUE (TOWEL DISPOSABLE) ×8 IMPLANT
TOWEL OR 17X26 10 PK STRL BLUE (TOWEL DISPOSABLE) ×8 IMPLANT
TRAY CATH LUMEN 1 20CM STRL (SET/KITS/TRAYS/PACK) ×4 IMPLANT
TRAY FOLEY IC TEMP SENS 16FR (CATHETERS) ×4 IMPLANT
TUBE FEEDING 8FR 16IN STR KANG (MISCELLANEOUS) ×4 IMPLANT
TUBING ART PRESS 48 MALE/FEM (TUBING) ×8 IMPLANT
TUBING INSUFFLATION (TUBING) ×4 IMPLANT
UNDERPAD 30X30 INCONTINENT (UNDERPADS AND DIAPERS) ×4 IMPLANT
VRC MALLEABLE SINGLE STG 36FR (MISCELLANEOUS) ×4
WATER STERILE IRR 1000ML POUR (IV SOLUTION) ×8 IMPLANT

## 2014-08-29 NOTE — Anesthesia Preprocedure Evaluation (Addendum)
Anesthesia Evaluation  Patient identified by MRN, date of birth, ID band Patient awake    Reviewed: Allergy & Precautions, NPO status   History of Anesthesia Complications Negative for: history of anesthetic complications  Airway Mallampati: II  TM Distance: >3 FB Neck ROM: Limited    Dental  (+) Dental Advisory Given, Edentulous Upper, Edentulous Lower   Pulmonary shortness of breath and with exertion, Current Smoker,          Cardiovascular hypertension, Pt. on medications +CHF + Valvular Problems/Murmurs MVP     Neuro/Psych CVA (uses walker), Residual Symptoms negative psych ROS   GI/Hepatic GERD-  ,  Endo/Other  negative endocrine ROS  Renal/GU CRFRenal disease     Musculoskeletal  (+) Arthritis -,   Abdominal   Peds  Hematology  (+) anemia ,   Anesthesia Other Findings   Reproductive/Obstetrics                           Anesthesia Physical Anesthesia Plan  ASA: III  Anesthesia Plan: General   Post-op Pain Management:    Induction: Intravenous  Airway Management Planned: Oral ETT  Additional Equipment: Arterial line, TEE and PA Cath  Intra-op Plan:   Post-operative Plan: Extubation in OR  Informed Consent: I have reviewed the patients History and Physical, chart, labs and discussed the procedure including the risks, benefits and alternatives for the proposed anesthesia with the patient or authorized representative who has indicated his/her understanding and acceptance.   Dental advisory given  Plan Discussed with: CRNA and Surgeon  Anesthesia Plan Comments:         Anesthesia Quick Evaluation

## 2014-08-29 NOTE — Interval H&P Note (Signed)
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 After consideration of risks, benefits and other options for treatment, the patient has consented to  Procedure(s): CORONARY ARTERY BYPASS GRAFTING (CABG) (N/A) MITRAL VALVE REPAIR (MVR) (N/A) TRANSESOPHAGEAL ECHOCARDIOGRAM (TEE) (N/A) as a surgical intervention .  The patient's history has been reviewed, patient examined, no change in status, stable for surgery.  I have reviewed the patient's chart and labs.  Questions were answered to the patient's satisfaction.     [name] C

## 2014-08-29 NOTE — Progress Notes (Signed)
Patient ID: Darren [name], male   DOB: 02/01/1941, 74 y.o.   MRN: [id number]  SICU Evening Rounds:   Hemodynamically stable, sinus 70's. On dopamine 8 and milrinone 0.3 CI = 1.5  Still asleep on vent  Urine output good  CT output low  CBC    Component Value Date/Time   WBC 5.6 09/22/2014 1510   RBC 3.66* 09/15/2014 1510   HGB 10.5* 09/18/2014 1514   HCT 31.0* 09/23/2014 1514   PLT 92* 08/31/2014 1510   MCV 88.0 09/08/2014 1510   MCH 30.6 09/06/2014 1510   MCHC 34.8 09/07/2014 1510   RDW 15.3 09/03/2014 1510   LYMPHSABS 1.6 07-22-2014 2052   MONOABS 0.5 07-22-2014 2052   EOSABS 0.0 07-22-2014 2052   BASOSABS 0.0 07-22-2014 2052     BMET    Component Value Date/Time   NA 138 09/09/2014 1514   K 4.1 08/28/2014 1514   CL 105 09/08/2014 1355   CO2 25 09/09/2014 0500   GLUCOSE 92 09/26/2014 1514   BUN 30* 09/08/2014 1355   CREATININE 0.90 09/17/2014 1355   CALCIUM 8.4 08/31/2014 0500   GFRNONAA 46* 09/20/2014 0500   GFRAA 54* 09/17/2014 0500     A/P:  Stable postop course. Will A-pace at 90 to increase cardiac output. He has already had 4 bottles of albumin.Continue current plans

## 2014-08-29 NOTE — Brief Op Note (Addendum)
08/21/2014 - 09/05/2014      [address]       [place] [id number]             [phone]     08/15/2014 - 09/04/2014  12:43 PM  PATIENT:  Darren [name]  74 y.o. 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  PRE-OPERATIVE WEIGHT: 60.4kg  EBL: SEE ANEST/PERFUSION RECORD  COMPLICATIONS: NO KNOWN  Mitral/Tricuspid/Pulmonary Valve Procedure  Mitral Valve Procedure Performed:  Repair: Annuloplasty. and Sliding Plasty.  Implant: Annuloplasty Device: Implant model number [id number], Size 32, Unique Device Identifier [id number].     Mitral Valve Etiology  MV Insufficiency: Severe  MV Disease: Yes.  MV Stenosis: No mitral valve stenosis.  MV Disease Functional Class: MV Disease Functional Class: Type II.  Etiology (Choose at least one and up to five): Degenerative.  MV Lesions (Choose at least one): Leaflet prolapse, posterior.   Findings Prebypass TEE- Severe MR moderate TR, moderate LV dysfnction Good targets and good conduits P2 prolapse with flail segment due to ruptured chord Postbypass TEE- no residual MR, mild TR, septal dyskinesis(pacing)  XC= 142 min CPB= 202 min, off with dopamine at 5 and milrinone at 0.3

## 2014-08-29 NOTE — Transfer of Care (Signed)
Immediate Anesthesia Transfer of Care Note  Patient: Darren [name]  Procedure(s) Performed: Procedure(s): CORONARY ARTERY BYPASS GRAFTING (CABG), ON PUMP, TIMES FOUR, USING LEFT INTERNAL MAMMARY ARTERY, RIGHT GREATER SAPHENOUS VEIN HARVESTED ENDOSCOPICALLY (N/A) MITRAL VALVE REPAIR (MVR) (N/A) TRANSESOPHAGEAL ECHOCARDIOGRAM (TEE) (N/A)  Patient Location: SICU  Anesthesia Type:General  Level of Consciousness: Patient remains intubated per anesthesia plan  Airway & Oxygen Therapy: Patient remains intubated per anesthesia plan and Patient placed on Ventilator (see vital sign flow sheet for setting)  Post-op Assessment: Report given to RN and Post -op Vital signs reviewed and stable  Post vital signs: Reviewed and stable  Last Vitals:  Filed Vitals:   09/14/2014 0511  BP: 104/71  Pulse: 75  Temp: 36.6 C  Resp: 18  1508- HR 80 paced, BP 104/51(65), RR16, Sats 100% on ventilator.  Complications: No apparent anesthesia complications

## 2014-08-29 NOTE — H&P (View-Only) (Signed)
Reason for Consult:74 vesel CAD and severe MR Referring Physician: Drs. Jordan/ Nahser  Darren [name] is an 74 y.o. male.  HPI: 74 yo man who presented on 08/10/2014 with cc/o shortness of breath and leg swelling.   He says he was in his usual state of health until about a month ago. He woke up one morning with back pain. He had compression fractures and required kyphoplasty. His family noted that about 2 days after the procedure he developed leg swelling. He became progressively more short of breath ovet the next week and his swelling worsened.   His family brought him to the ED. He was noted have peripheral edema and a heart murmur. He had CHF and bilateral effusions on chest x-ray. His BNP and troponin were elevated. He was admitted. An echo showed severe MR with posterior leaflet prolapse. EF was 30- 35 %. He underwent cardiac catheterization yesterday which revealed moderate pulmonary hypertension and severe 3 vessel CAD.  His symptoms have improved since admission.   Past Medical History  Diagnosis Date  . Hypertension   . Hyperlipidemia   . GERD (gastroesophageal reflux disease)   . Carotid bruit   . Stroke     uses walker  . Shortness of breath dyspnea     with exertion   . Arthritis   . CHF (congestive heart failure) 07/2014    " NEW ONSET "    Past Surgical History  Procedure Laterality Date  . Hernia repair    . Tonsillectomy    . Mouth surgery    . Kyphoplasty N/A 08/07/2014    Procedure:  Lumbar Three Kyphoplasty;  Surgeon: Mark Leonard Dumonski, MD;  Location: [facility] OR;  Service: Orthopedics;  Laterality: N/A;  Lumbar 3 kyphoplasty  . Left and right heart catheterization with coronary angiogram N/A 08/23/2014    Procedure: LEFT AND RIGHT HEART CATHETERIZATION WITH CORONARY ANGIOGRAM;  Surgeon: Peter [name] Jordan, MD;  Location: [facility] CATH LAB;  Service: Cardiovascular;  Laterality: N/A;  . Tee without cardioversion N/A 08/25/2014    Procedure: TRANSESOPHAGEAL ECHOCARDIOGRAM  (TEE);  Surgeon: Philip J Nahser, MD;  Location: [facility] ENDOSCOPY;  Service: Cardiovascular;  Laterality: N/A;    Family History  Problem Relation Age of Onset  . Alzheimer's disease Mother   . Dementia Mother   . Emphysema Father   . Hypertension Sister   . Hypertension Brother   . Cancer Sister     Social History:  reports that he has been smoking Cigarettes.  He has a 5.7 pack-year smoking history. He has never used smokeless tobacco. He reports that he does not drink alcohol or use illicit drugs.  Allergies: No Known Allergies  Medications:  Scheduled: . aspirin EC  81 mg Oral Daily  . atorvastatin  80 mg Oral q1800  . potassium chloride  20 mEq Oral Daily  . sodium chloride  3 mL Intravenous Q12H    Results for orders placed or performed during the hospital encounter of 08/12/2014 (from the past 48 hour(s))  Heparin level (unfractionated)     Status: None   Collection Time: 08/22/2014  3:58 AM  Result Value Ref Range   Heparin Unfractionated 0.51 0.30 - 0.70 IU/mL    Comment:        IF HEPARIN RESULTS ARE BELOW EXPECTED VALUES, AND PATIENT DOSAGE HAS BEEN CONFIRMED, SUGGEST FOLLOW UP TESTING OF ANTITHROMBIN III LEVELS.   CBC     Status: Abnormal   Collection Time: 08/02/2014  3:58 AM  Result   Value Ref Range   WBC 6.5 4.0 - 10.5 K/uL   RBC 3.94 (L) 4.22 - 5.81 MIL/uL   Hemoglobin 12.1 (L) 13.0 - 17.0 g/dL   HCT 36.3 (L) 39.0 - 52.0 %   MCV 92.1 78.0 - 100.0 fL   MCH 30.7 26.0 - 34.0 pg   MCHC 33.3 30.0 - 36.0 g/dL   RDW 15.5 11.5 - 15.5 %   Platelets 323 150 - 400 K/uL  Basic metabolic panel     Status: Abnormal   Collection Time: 08/21/2014  3:58 AM  Result Value Ref Range   Sodium 132 (L) 135 - 145 mmol/L   Potassium 4.6 3.5 - 5.1 mmol/L   Chloride 105 96 - 112 mmol/L   CO2 22 19 - 32 mmol/L   Glucose, Bld 113 (H) 70 - 99 mg/dL   BUN 35 (H) 6 - 23 mg/dL   Creatinine, Ser 1.42 (H) 0.50 - 1.35 mg/dL   Calcium 8.2 (L) 8.4 - 10.5 mg/dL   GFR calc non Af Amer 47 (L)  >90 mL/min   GFR calc Af Amer 55 (L) >90 mL/min    Comment: (NOTE) The eGFR has been calculated using the CKD EPI equation. This calculation has not been validated in all clinical situations. eGFR's persistently <90 mL/min signify possible Chronic Kidney Disease.    Anion gap 5 5 - 15  Protime-INR     Status: None   Collection Time: 08/22/2014  3:58 AM  Result Value Ref Range   Prothrombin Time 14.9 11.6 - 15.2 seconds   INR 1.16 0.00 - 1.49  I-STAT 3, venous blood gas (G3P V)     Status: Abnormal   Collection Time: 08/12/2014  2:54 PM  Result Value Ref Range   pH, Ven 7.390 (H) 7.250 - 7.300   pCO2, Ven 31.9 (L) 45.0 - 50.0 mmHg   pO2, Ven 44.0 30.0 - 45.0 mmHg   Bicarbonate 19.3 (L) 20.0 - 24.0 mEq/L   TCO2 20 0 - 100 mmol/L   O2 Saturation 80.0 %   Acid-base deficit 5.0 (H) 0.0 - 2.0 mmol/L   Sample type VENOUS   I-STAT 3, arterial blood gas (G3+)     Status: Abnormal   Collection Time: 08/04/2014  3:01 PM  Result Value Ref Range   pH, Arterial 7.451 (H) 7.350 - 7.450   pCO2 arterial 27.6 (L) 35.0 - 45.0 mmHg   pO2, Arterial 111.0 (H) 80.0 - 100.0 mmHg   Bicarbonate 19.2 (L) 20.0 - 24.0 mEq/L   TCO2 20 0 - 100 mmol/L   O2 Saturation 99.0 %   Acid-base deficit 4.0 (H) 0.0 - 2.0 mmol/L   Sample type ARTERIAL   I-STAT 3, venous blood gas (G3P V)     Status: Abnormal   Collection Time: 08/16/2014  3:03 PM  Result Value Ref Range   pH, Ven 7.402 (H) 7.250 - 7.300   pCO2, Ven 31.9 (L) 45.0 - 50.0 mmHg   pO2, Ven 43.0 30.0 - 45.0 mmHg   Bicarbonate 19.8 (L) 20.0 - 24.0 mEq/L   TCO2 21 0 - 100 mmol/L   O2 Saturation 80.0 %   Acid-base deficit 4.0 (H) 0.0 - 2.0 mmol/L   Sample type VENOUS   I-STAT 3, venous blood gas (G3P V)     Status: Abnormal   Collection Time: 08/13/2014  3:05 PM  Result Value Ref Range   pH, Ven 7.387 (H) 7.250 - 7.300   pCO2, Ven 32.7 (L) 45.0 -   50.0 mmHg   pO2, Ven 46.0 (H) 30.0 - 45.0 mmHg   Bicarbonate 19.7 (L) 20.0 - 24.0 mEq/L   TCO2 21 0 - 100 mmol/L    O2 Saturation 82.0 %   Acid-base deficit 5.0 (H) 0.0 - 2.0 mmol/L   Sample type VENOUS   POCT Activated clotting time     Status: None   Collection Time: 08/14/2014  3:11 PM  Result Value Ref Range   Activated Clotting Time 91 seconds  CBC     Status: Abnormal   Collection Time: 08/27/14  5:54 AM  Result Value Ref Range   WBC 5.9 4.0 - 10.5 K/uL   RBC 3.81 (L) 4.22 - 5.81 MIL/uL   Hemoglobin 11.6 (L) 13.0 - 17.0 g/dL   HCT 35.1 (L) 39.0 - 52.0 %   MCV 92.1 78.0 - 100.0 fL   MCH 30.4 26.0 - 34.0 pg   MCHC 33.0 30.0 - 36.0 g/dL   RDW 15.5 11.5 - 15.5 %   Platelets 293 150 - 400 K/uL  Heparin level (unfractionated)     Status: None   Collection Time: 08/27/14 11:41 AM  Result Value Ref Range   Heparin Unfractionated 0.34 0.30 - 0.70 IU/mL    Comment:        IF HEPARIN RESULTS ARE BELOW EXPECTED VALUES, AND PATIENT DOSAGE HAS BEEN CONFIRMED, SUGGEST FOLLOW UP TESTING OF ANTITHROMBIN III LEVELS.   Basic metabolic panel     Status: Abnormal   Collection Time: 08/27/14 11:52 AM  Result Value Ref Range   Sodium 137 135 - 145 mmol/L   Potassium 4.5 3.5 - 5.1 mmol/L   Chloride 105 96 - 112 mmol/L   CO2 21 19 - 32 mmol/L   Glucose, Bld 130 (H) 70 - 99 mg/dL   BUN 30 (H) 6 - 23 mg/dL   Creatinine, Ser 1.52 (H) 0.50 - 1.35 mg/dL   Calcium 8.8 8.4 - 10.5 mg/dL   GFR calc non Af Amer 44 (L) >90 mL/min   GFR calc Af Amer 51 (L) >90 mL/min    Comment: (NOTE) The eGFR has been calculated using the CKD EPI equation. This calculation has not been validated in all clinical situations. eGFR's persistently <90 mL/min signify possible Chronic Kidney Disease.    Anion gap 11 5 - 15    No results found.  Review of Systems  Constitutional: Positive for malaise/fatigue. Negative for fever and chills.  Respiratory: Positive for shortness of breath.   Cardiovascular: Positive for chest pain and leg swelling.  Gastrointestinal: Positive for constipation.  Musculoskeletal: Positive for  back pain.  All other systems reviewed and are negative.  Blood pressure 100/66, pulse 74, temperature 97.1 F (36.2 C), temperature source Oral, resp. rate 18, height 5' 7" (1.702 [name]), weight 135 lb 3.2 oz (61.326 kg), SpO2 99 %. Physical Exam  Vitals reviewed. Constitutional: He is oriented to person, place, and time.  Appear older than stated age  HENT:  Head: Normocephalic and atraumatic.  dentures  Eyes: EOM are normal. Pupils are equal, round, and reactive to light.  Neck: Neck supple. No thyromegaly present.  Cardiovascular: Normal rate and regular rhythm.   Murmur (3/6 early systolic) heard. Respiratory: Effort normal.  Diminished BS bilaterally L>R   GI: Soft. There is no tenderness.  Musculoskeletal: He exhibits edema (1+).  Lymphadenopathy:    He has no cervical adenopathy.  Neurological: He is alert and oriented to person, place, and time. No cranial nerve deficit.  No  focal motor deficit  Skin: Skin is warm and dry.   Echocardiogram Transesophageal Echocardiography  Patient:  Darren [name], Darren [name] MR #:    [id number] Study Date: 08/18/2014 Gender:   [name] Age:    73 Height:   175.3 cm Weight:   58.2 kg BSA:    1.67 [name]^2 Pt. Status: Room:    3E29C  PERFORMING  Philip Nahser, [name].D. ORDERING   Daichi Moris, MD ATTENDING  Glick, David [id number] REFERRING  Christopher Berge, MD ADMITTING  Varanasi, Jayadeep S SONOGRAPHER Tina West  cc:  ------------------------------------------------------------------- LV EF: 30% -  35%  ------------------------------------------------------------------- Indications:   424.0 Mitral valve disease.  ------------------------------------------------------------------- History:  PMH: Dilated cardiomyopathy. Severe MR. Elevated troponin. Congestive heart failure.  ------------------------------------------------------------------- Study Conclusions  - Left ventricle: Systolic  function was moderately to severely reduced. The estimated ejection fraction was in the range of 30% to 35%. - Aortic valve: No evidence of vegetation. There was mild regurgitation. - Mitral valve: Severe prolapse, involving the posterior leaflet. There was severe regurgitation. - Left atrium: No evidence of thrombus in the atrial cavity or appendage. - Tricuspid valve: No evidence of vegetation.  Diagnostic transesophageal echocardiography. 2D and color Doppler. Birthdate: Patient birthdate: 02/05/1941. Age: Patient is 73 yr old. Sex: Gender: male.  BMI: 18.9 kg/[name]^2. Blood pressure: 92/68 Patient status: Inpatient. Study date: Study date: 08/22/2014. Study time: 10:39 AM. Location: Endoscopy.  -------------------------------------------------------------------  ------------------------------------------------------------------- Left ventricle: Systolic function was moderately to severely reduced. The estimated ejection fraction was in the range of 30% to 35%.  ------------------------------------------------------------------- Aortic valve:  Structurally normal valve.  Cusp separation was normal. No evidence of vegetation. Doppler: There was mild regurgitation.  ------------------------------------------------------------------- Aorta: The aorta was mildly calcified.  ------------------------------------------------------------------- Mitral valve: There is prolapse of the posterior leaflet of the mitral valve . this is associated with severe MR. Severe prolapse, involving the posterior leaflet. Doppler: There was severe regurgitation.  ------------------------------------------------------------------- Left atrium:  No evidence of thrombus in the atrial cavity or appendage.  ------------------------------------------------------------------- Pulmonic valve:  Doppler: There was trivial  regurgitation.  ------------------------------------------------------------------- Tricuspid valve:  Structurally normal valve.  Leaflet separation was normal. No evidence of vegetation. Doppler: There was mild regurgitation.  ------------------------------------------------------------------- Post procedure conclusions Ascending Aorta:  - The aorta was mildly calcified.  ------------------------------------------------------------------- Prepared and Electronically Authenticated by  Philip Nahser, [name].D. 2016-02-29T18:20:43  Cardiac catheterization Procedural Findings: Hemodynamics RA 16/17 mean 15 mm Hg RV 54/10/16 mm Hg PA 58/24 mean 36 mm Hg PCWP 32/51 mean 32 mm Hg, very large V waves LV 134/23/37 mm Hg AO 134/76 mean 101 mm Hg  Oxygen saturations: RA 82% RV 80% PA 80% AO 99%  Cardiac Output (Fick) 8.0 L/min  Cardiac Index (Fick) 4.76 L/min/meter squared.  Coronary angiography: Coronary dominance: right  Left mainstem: Normal  Left anterior descending (LAD): 80-90% proximal. Diffuse disease in the proximal vessel.   Left circumflex (LCx): 80% proximal.  Right coronary artery (RCA): Diffuse disease in the proximal vessel up to 70%. The mid vessel is 100% occluded after the RV marginal branch.   Left ventriculography: Not done  Final Conclusions:  1. Severe 3 vessel obstructive CAD 2. Elevated LV filling pressures with large V waves and high cardiac output consistent with severe MR. 3. Moderate pulmonary HTN  Recommendations: Consider CABG/MVR  Pulmonary function tests FVC= 1.75 (46%) FEV1= 0.92(33%), 1.11 post DLCO= 20 % of predicted  Assessment/Plan: 74 yo man who presented with class IV CHF and a probable NSTEMI. He has been found to have   severe MR with posterior leaflet prolapse and severe 3 vessel CAD. He has impaired LV function. Other findings of note include acute on chronic kidney disease with creatinine now back at baseline  of 1.5. PFTs show severe COPD with an FEV1 of 0.91, although there was improvement with bronchodilators. His DLCO was even more severely impaired but his CHF may be playing a role in that.  He needs CABG, mitral repair for survival benefit and relief of symptoms.  I discussed with the patient the general nature of the procedure, the need for general anesthesia, the use of cardiopulmonary bypass, and the incisions to be used. We discussed the expected hospital stay, overall recovery and short and long term outcomes. I reviewed the indications, risks, benefits and alternatives. He understands the risks include but are not limited to death, stroke, MI, DVT/PE, bleeding, possible need for transfusion, infections, cardiac arrhythmias, heart block requiring pacemaker placement, as well as other organ system dysfunction including respiratory, renal, or GI complications. He is at high risk for respiratory and renal complications in particular.   He accepts the risks and agrees to proceed.  Plan CABG, mitral repair on Thursday 09/17/2014 Shanaia Sievers C 08/27/2014, 5:44 PM      

## 2014-08-29 NOTE — Progress Notes (Signed)
  Echocardiogram Echocardiogram Transesophageal has been performed.  Arvil ChacoFoster, Samanthajo Payano 09/02/2014, 8:50 AM

## 2014-08-29 NOTE — Anesthesia Procedure Notes (Signed)
Procedures   The patient was identified and consent obtained.  TO was performed, and full barrier precautions were used.  The skin was anesthetized with lidocaine.  Once the vein was located with the 22 ga. needle using ultrasound guidance , the wire was inserted into the vein.  The wire location was confirmed with ultrasound.  The insertion site was dilated and the introducer was carefully inserted and sutured in place. The PAC was checked, and floated into the PA.  Once in the PA, the catheter was secured. The patient tolerated the procedure well.  CXR was ordered for PACU. Start: 0709 End: 0719 J. Claybon Jabsaniel Zenab Gronewold, MD

## 2014-08-30 ENCOUNTER — Inpatient Hospital Stay (HOSPITAL_COMMUNITY): Payer: Medicare Other

## 2014-08-30 ENCOUNTER — Encounter (HOSPITAL_COMMUNITY): Payer: Self-pay | Admitting: Thoracic Surgery (Cardiothoracic Vascular Surgery)

## 2014-08-30 LAB — POCT I-STAT, CHEM 8
BUN: 33 mg/dL — AB (ref 6–23)
Calcium, Ion: 1.18 mmol/L (ref 1.13–1.30)
Chloride: 105 mmol/L (ref 96–112)
Creatinine, Ser: 1.4 mg/dL — ABNORMAL HIGH (ref 0.50–1.35)
Glucose, Bld: 175 mg/dL — ABNORMAL HIGH (ref 70–99)
HCT: 33 % — ABNORMAL LOW (ref 39.0–52.0)
Hemoglobin: 11.2 g/dL — ABNORMAL LOW (ref 13.0–17.0)
Potassium: 4.3 mmol/L (ref 3.5–5.1)
SODIUM: 135 mmol/L (ref 135–145)
TCO2: 17 mmol/L (ref 0–100)

## 2014-08-30 LAB — BASIC METABOLIC PANEL
ANION GAP: 9 (ref 5–15)
BUN: 26 mg/dL — ABNORMAL HIGH (ref 6–23)
CHLORIDE: 111 mmol/L (ref 96–112)
CO2: 18 mmol/L — AB (ref 19–32)
Calcium: 8.4 mg/dL (ref 8.4–10.5)
Creatinine, Ser: 1.31 mg/dL (ref 0.50–1.35)
GFR calc Af Amer: 61 mL/min — ABNORMAL LOW (ref >90)
GFR calc non Af Amer: 52 mL/min — ABNORMAL LOW (ref >90)
GLUCOSE: 97 mg/dL (ref 70–99)
Potassium: 4.4 mmol/L (ref 3.5–5.1)
Sodium: 138 mmol/L (ref 135–145)

## 2014-08-30 LAB — CARBOXYHEMOGLOBIN
Carboxyhemoglobin: 1.2 % (ref 0.5–1.5)
Methemoglobin: 1 % (ref 0.0–1.5)
O2 Saturation: 80.1 %
Total hemoglobin: 9.8 g/dL — ABNORMAL LOW (ref 13.5–18.0)

## 2014-08-30 LAB — CBC
HCT: 30.2 % — ABNORMAL LOW (ref 39.0–52.0)
HCT: 30.2 % — ABNORMAL LOW (ref 39.0–52.0)
HEMOGLOBIN: 10.3 g/dL — AB (ref 13.0–17.0)
Hemoglobin: 10.4 g/dL — ABNORMAL LOW (ref 13.0–17.0)
MCH: 30.8 pg (ref 26.0–34.0)
MCH: 30.5 pg (ref 26.0–34.0)
MCHC: 34.4 g/dL (ref 30.0–36.0)
MCHC: 34.1 g/dL (ref 30.0–36.0)
MCV: 89.3 fL (ref 78.0–100.0)
MCV: 89.3 fL (ref 78.0–100.0)
PLATELETS: 95 10*3/uL — AB (ref 150–400)
Platelets: 78 10*3/uL — ABNORMAL LOW (ref 150–400)
RBC: 3.38 MIL/uL — AB (ref 4.22–5.81)
RBC: 3.38 MIL/uL — ABNORMAL LOW (ref 4.22–5.81)
RDW: 16 % — AB (ref 11.5–15.5)
RDW: 15.9 % — ABNORMAL HIGH (ref 11.5–15.5)
WBC: 7.3 10*3/uL (ref 4.0–10.5)
WBC: 7.9 10*3/uL (ref 4.0–10.5)

## 2014-08-30 LAB — PREPARE FRESH FROZEN PLASMA
Unit division: 0
Unit division: 0

## 2014-08-30 LAB — POCT I-STAT 3, ART BLOOD GAS (G3+)
ACID-BASE DEFICIT: 3 mmol/L — AB (ref 0.0–2.0)
ACID-BASE DEFICIT: 5 mmol/L — AB (ref 0.0–2.0)
BICARBONATE: 19.3 meq/L — AB (ref 20.0–24.0)
BICARBONATE: 20.5 meq/L (ref 20.0–24.0)
O2 Saturation: 100 %
O2 Saturation: 99 %
PCO2 ART: 29.4 mmHg — AB (ref 35.0–45.0)
PH ART: 7.449 (ref 7.350–7.450)
PO2 ART: 108 mmHg — AB (ref 80.0–100.0)
PO2 ART: 162 mmHg — AB (ref 80.0–100.0)
Patient temperature: 36.4
TCO2: 20 mmol/L (ref 0–100)
TCO2: 21 mmol/L (ref 0–100)
pCO2 arterial: 30.4 mmHg — ABNORMAL LOW (ref 35.0–45.0)
pH, Arterial: 7.408 (ref 7.350–7.450)

## 2014-08-30 LAB — CREATININE, SERUM
Creatinine, Ser: 1.51 mg/dL — ABNORMAL HIGH (ref 0.50–1.35)
GFR, EST AFRICAN AMERICAN: 51 mL/min — AB (ref >90)
GFR, EST NON AFRICAN AMERICAN: 44 mL/min — AB (ref >90)

## 2014-08-30 LAB — GLUCOSE, CAPILLARY
GLUCOSE-CAPILLARY: 109 mg/dL — AB (ref 70–99)
Glucose-Capillary: 115 mg/dL — ABNORMAL HIGH (ref 70–99)
Glucose-Capillary: 129 mg/dL — ABNORMAL HIGH (ref 70–99)
Glucose-Capillary: 86 mg/dL (ref 70–99)
Glucose-Capillary: 159 mg/dL — ABNORMAL HIGH (ref 70–99)

## 2014-08-30 LAB — HEMOGLOBIN A1C
Hgb A1c MFr Bld: 5.6 % (ref 4.8–5.6)
Mean Plasma Glucose: 114 mg/dL

## 2014-08-30 LAB — PREPARE PLATELET PHERESIS: Unit division: 0

## 2014-08-30 LAB — MAGNESIUM
MAGNESIUM: 2.8 mg/dL — AB (ref 1.5–2.5)
Magnesium: 3 mg/dL — ABNORMAL HIGH (ref 1.5–2.5)

## 2014-08-30 MED ORDER — ALBUMIN HUMAN 5 % IV SOLN
12.5000 g | Freq: Once | INTRAVENOUS | Status: AC
  Administered 2014-08-30: 12.5 g via INTRAVENOUS

## 2014-08-30 MED ORDER — INSULIN ASPART 100 UNIT/ML ~~LOC~~ SOLN
0.0000 [IU] | SUBCUTANEOUS | Status: DC
  Administered 2014-08-31 – 2014-09-02 (×5): 2 [IU] via SUBCUTANEOUS

## 2014-08-30 MED ORDER — CALCIUM CHLORIDE 10 % IV SOLN
1.0000 g | Freq: Once | INTRAVENOUS | Status: AC
Start: 2014-08-30 — End: 2014-08-30
  Administered 2014-08-30: 1 g via INTRAVENOUS
  Filled 2014-08-30: qty 10

## 2014-08-30 MED ORDER — SODIUM CHLORIDE 0.9 % IV SOLN: INTRAVENOUS | Status: DC

## 2014-08-30 MED ORDER — SODIUM CHLORIDE 0.9 % IV SOLN
INTRAVENOUS | Status: DC
  Administered 2014-08-30: 10 mL/h via INTRAVENOUS
  Administered 2014-09-02: 10 mL via INTRAVENOUS

## 2014-08-30 MED ORDER — ALBUMIN HUMAN 5 % IV SOLN
INTRAVENOUS | Status: AC
  Filled 2014-08-30: qty 250

## 2014-08-30 MED ORDER — ALBUMIN HUMAN 5 % IV SOLN
12.5000 g | Freq: Once | INTRAVENOUS | Status: AC
  Filled 2014-08-30: qty 250

## 2014-08-30 MED ORDER — FUROSEMIDE 10 MG/ML IJ SOLN
40.0000 mg | Freq: Once | INTRAMUSCULAR | Status: AC
  Administered 2014-08-30: 40 mg via INTRAVENOUS

## 2014-08-30 MED FILL — Heparin Sodium (Porcine) Inj 1000 Unit/ML: INTRAMUSCULAR | Qty: 30 | Status: AC

## 2014-08-30 MED FILL — Magnesium Sulfate Inj 50%: INTRAMUSCULAR | Qty: 10 | Status: AC

## 2014-08-30 MED FILL — Potassium Chloride Inj 2 mEq/ML: INTRAVENOUS | Qty: 40 | Status: AC

## 2014-08-30 NOTE — Procedures (Signed)
Extubation Procedure Note  Patient Details:   Name: Darren HarrisonRichard M [name] DOB: 02/06/1941 MRN: [id number]   Airway Documentation:  Patient extubated to 4 lpm nasal cannula.  VC 700 ml, NIF -25, patient able to hold head off bed 10 seconds.  Patient able to breathe around deflated cuff and vocalize post procedure.  Tolerated well, no complications.   Evaluation  O2 sats: stable throughout Complications: No apparent complications Patient did tolerate procedure well. Bilateral Breath Sounds: Clear   Yes  [name], [name] 08/30/2014, 12:20 AM

## 2014-08-30 NOTE — Progress Notes (Signed)
INITIAL NUTRITION ASSESSMENT  DOCUMENTATION CODES Per approved criteria  -Not Applicable   INTERVENTION:  Resource Breeze PO TID, each supplement provides 250 kcal and 9 grams of protein  NUTRITION DIAGNOSIS: Inadequate oral intake related to recent surgery as evidenced by clear liquid diet.   Goal: Intake to meet >90% of estimated nutrition needs.  Monitor:  PO intake, labs, weight trend.  Reason for Assessment: Low Braden  74 y.o. male  Admitting Dx: Acute combined systolic and diastolic CHF, NYHA class 3  ASSESSMENT: Patient presented to the hospital on 2/22 with c/o SOB and leg swelling. 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  Prior to surgery on 3/3, patient was eating 50-100% of heart healthy meals. He says he has not lost weight. Had clear liquids for breakfast today, tolerated well. RN may advance to full liquids for lunch. Patient would benefit from clear liquid supplement to maximize intake while recovering from surgery. Agreed to try Resource Breeze supplements.  Nutrition Focused Physical Exam:  Subcutaneous Fat:  Orbital Region: WNL Upper Arm Region: WNL Thoracic and Lumbar Region: NA  Muscle:  Temple Region: WNL Clavicle Bone Region: WNL Clavicle and Acromion Bone Region: WNL Scapular Bone Region: NA Dorsal Hand: WNL Patellar Region: mild depletion Anterior Thigh Region: mild depletion Posterior Calf Region: WNL  Edema: mild   Height: Ht Readings from Last 1 Encounters:  08/20/14 5\' 7"  (1.702 m)    Weight: Wt Readings from Last 1 Encounters:  08/30/14 142 lb 10.2 oz (64.7 kg)    Ideal Body Weight: 67.3 kg  % Ideal Body Weight: 96%  Wt Readings from Last 10 Encounters:  08/30/14 142 lb 10.2 oz (64.7 kg)  08/07/14 132 lb (59.875 kg)    Usual Body Weight: unknown  % Usual Body Weight: n/a  BMI:  Body mass index is 22.33 kg/(m^2).  Estimated Nutritional Needs: Kcal: 1700-1900 Protein: 80-90 gm Fluid: 1.8 L  Skin: surgical incisions  Diet Order:  Diet full liquid  EDUCATION NEEDS: -Education needs addressed   Intake/Output Summary (Last 24 hours) at 08/30/14 1110 Last data filed at 08/30/14 1100  Gross per 24 hour  Intake 5783.21 ml  Output   3200 ml  Net 2583.21 ml    Last BM: 2/29   Labs:   Recent Labs Lab 08/28/14 0555 09/17/2014 0500  09/01/2014 1355 09/04/2014 1514 09/21/2014 2133 09/11/2014 2135 08/30/14 0429  NA 137 136  < > 138 138 134*  --  138  K 4.1 4.4  < > 4.6 4.1 4.3  --  4.4  CL 104 102  < > 105  --  113*  --  111  CO2 24 25  --   --   --   --   --  18*  BUN 32* 35*  < > 30*  --  26*  --  26*  CREATININE 1.38* 1.45*  < > 0.90  --  1.10 1.36* 1.31  CALCIUM 8.5 8.4  --   --   --   --   --  8.4  MG  --   --   --   --   --   --  3.5* 3.0*  GLUCOSE 90 90  < > 115* 92 156*  --  97  < > = values in this interval not displayed.  CBG (last 3)   Recent Labs  09/13/2014 1608 09/18/2014 1712 09/04/2014 2014  GLUCAP 97 106* 126*    Scheduled Meds: . acetaminophen  1,000 mg Oral 4 times per  day   Or  . acetaminophen (TYLENOL) oral liquid 160 mg/5 mL  1,000 mg Per Tube 4 times per day  . antiseptic oral rinse  7 mL Mouth Rinse QID  . aspirin EC  325 mg Oral Daily   Or  . aspirin  324 mg Per Tube Daily  . atorvastatin  80 mg Oral q1800  . bisacodyl  10 mg Oral Daily   Or  . bisacodyl  10 mg Rectal Daily  . cefUROXime (ZINACEF)  IV  1.5 g Intravenous Q12H  . chlorhexidine  15 mL Mouth Rinse BID  . docusate sodium  200 mg Oral Daily  . insulin aspart  0-24 Units Subcutaneous 6 times per day  . insulin aspart  0-24 Units Subcutaneous 6 times per day  . insulin regular  0-10 Units Intravenous TID WC  . metoprolol tartrate  12.5 mg Oral BID   Or  . metoprolol tartrate  12.5 mg Per Tube BID  . [START ON 08/31/2014] pantoprazole  40 mg Oral Daily  . sodium chloride  3 mL Intravenous Q12H    Continuous Infusions: . sodium chloride 20 mL/hr (09/24/2014 1501)  . sodium chloride    . sodium chloride    .  dexmedetomidine Stopped (09/11/2014 1800)  . DOPamine 4.5 mcg/kg/min (08/30/14 1100)  . insulin (NOVOLIN-R) infusion Stopped (08/31/2014 1501)  . lactated ringers 20 mL/hr (08/31/2014 1501)  . lactated ringers    . milrinone 0.3 mcg/kg/min (08/30/14 1000)  . nitroGLYCERIN    . phenylephrine (NEO-SYNEPHRINE) Adult infusion 5 mcg/min (08/30/14 1000)    Past Medical History  Diagnosis Date  . Hypertension   . Hyperlipidemia   . GERD (gastroesophageal reflux disease)   . Carotid bruit   . Stroke     uses walker  . Shortness of breath dyspnea     with exertion   . Arthritis   . CHF (congestive heart failure) 07/2014    " NEW ONSET "  . S/P CABG x 4   . S/P mitral valve repair     Past Surgical History  Procedure Laterality Date  . Hernia repair    . Tonsillectomy    . Mouth surgery    . Kyphoplasty N/A 08/07/2014    Procedure:  Lumbar Three Kyphoplasty;  Surgeon: Emilee Hero, MD;  Location: [facility] OR;  Service: Orthopedics;  Laterality: N/A;  Lumbar 3 kyphoplasty  . Left and right heart catheterization with coronary angiogram N/A 08/04/2014    Procedure: LEFT AND RIGHT HEART CATHETERIZATION WITH CORONARY ANGIOGRAM;  Surgeon: Peter M Swaziland, MD;  Location: [facility] CATH LAB;  Service: Cardiovascular;  Laterality: N/A;  . Tee without cardioversion N/A 08/16/2014    Procedure: TRANSESOPHAGEAL ECHOCARDIOGRAM (TEE);  Surgeon: Vesta Mixer, MD;  Location: [facility] ENDOSCOPY;  Service: Cardiovascular;  Laterality: N/A;    Joaquin Courts, RD, LDN, CNSC Pager [phone] After Hours Pager [phone]

## 2014-08-30 NOTE — Progress Notes (Signed)
TCTS DAILY ICU PROGRESS NOTE                   [address]            [place] [id number]          [phone]   1 Day Post-Op Procedure(s) (LRB): CORONARY ARTERY BYPASS GRAFTING (CABG), ON PUMP, TIMES FOUR, USING LEFT INTERNAL MAMMARY ARTERY, RIGHT GREATER SAPHENOUS VEIN HARVESTED ENDOSCOPICALLY (N/A) MITRAL VALVE REPAIR (MVR) (N/A) TRANSESOPHAGEAL ECHOCARDIOGRAM (TEE) (N/A)  Total Length of Stay:  LOS: 10 days   Subjective Minimal pain  Objective: Vital signs in last 24 hours: Temp:  [96.6 F (35.9 C)-97.9 F (36.6 C)] 97.5 F (36.4 C) (03/04 0700) Pulse Rate:  [33-95] 89 (03/04 0700) Cardiac Rhythm:  [-] Atrial paced (03/04 0600) Resp:  [11-24] 17 (03/04 0700) BP: (60-99)/(45-71) 96/61 mmHg (03/04 0300) SpO2:  [94 %-100 %] 100 % (03/04 0700) Arterial Line BP: (89-122)/(54-70) 115/63 mmHg (03/04 0700) FiO2 (%):  [40 %-50 %] 40 % (03/03 2338) Weight:  [142 lb 10.2 oz (64.7 kg)] 142 lb 10.2 oz (64.7 kg) (03/04 0351)  Filed Weights   08/28/14 0646 Sep 28, 2014 0511 08/30/14 0351  Weight: 134 lb 4.8 oz (60.918 kg) 133 lb 2.5 oz (60.4 kg) 142 lb 10.2 oz (64.7 kg)    Weight change: 9 lb 7.7 oz (4.3 kg)   Hemodynamic parameters for last 24 hours: PAP: (24-39)/(7-25) 33/15 mmHg CVP:  [76 mmHg] 76 mmHg CO:  [2.6 L/min-3.5 L/min] 3.2 L/min CI:  [1.5 L/min/m2-2.1 L/min/m2] 1.9 L/min/m2  Intake/Output from previous day: 03/03 0701 - 03/04 0700 In: 6403.6 [I.V.:4022.6; Blood:1371; NG/GT:60; IV Piggyback:950] Out: 2795 [XLKGM:0102; Chest Tube:360]  Intake/Output this shift:    Current Meds: Scheduled Meds: . acetaminophen  1,000 mg Oral 4 times per day   Or  . acetaminophen (TYLENOL) oral liquid 160 mg/5 mL  1,000 mg Per Tube 4 times per day  . antiseptic oral rinse  7 mL Mouth Rinse QID  . aspirin EC  325 mg Oral Daily   Or  . aspirin  324 mg Per Tube Daily  . atorvastatin  80 mg Oral q1800  . bisacodyl  10 mg Oral Daily   Or  . bisacodyl  10 mg  Rectal Daily  . cefUROXime (ZINACEF)  IV  1.5 g Intravenous Q12H  . chlorhexidine  15 mL Mouth Rinse BID  . docusate sodium  200 mg Oral Daily  . famotidine (PEPCID) IV  20 mg Intravenous Q12H  . insulin aspart  0-24 Units Subcutaneous 6 times per day  . insulin regular  0-10 Units Intravenous TID WC  . metoprolol tartrate  12.5 mg Oral BID   Or  . metoprolol tartrate  12.5 mg Per Tube BID  . [START ON 08/31/2014] pantoprazole  40 mg Oral Daily  . sodium chloride  3 mL Intravenous Q12H   Continuous Infusions: . sodium chloride 20 mL/hr (Sep 28, 2014 1501)  . sodium chloride    . sodium chloride    . dexmedetomidine Stopped (September 28, 2014 1800)  . DOPamine 5 mcg/kg/min (08/30/14 0600)  . insulin (NOVOLIN-R) infusion Stopped (09-28-14 1501)  . lactated ringers 20 mL/hr (28-Sep-2014 1501)  . lactated ringers    . milrinone 0.3 mcg/kg/min (08/30/14 0600)  . nitroGLYCERIN    . phenylephrine (NEO-SYNEPHRINE) Adult infusion 12 mcg/min (08/30/14 0600)   PRN Meds:.sodium chloride, albumin human, metoprolol, midazolam, morphine injection, ondansetron (ZOFRAN) IV, oxyCODONE, sodium chloride, traMADol  General appearance: alert, cooperative and  no distress Neurologic: intact Heart: regular rate and rhythm and + rub with CT's in place Lungs: clear to auscultation bilaterally and no wheeze/crackles Abdomen: soft, nontender Extremities: + LE edema Wound: incis healing well  Lab Results: CBC: Recent Labs  09/07/2014 2135 08/30/14 0429  WBC 6.8 7.3  HGB 10.6* 10.4*  HCT 30.6* 30.2*  PLT 96* 95*   BMET:  Recent Labs  09/17/2014 0500  08/28/2014 2133 09/19/2014 2135 08/30/14 0429  NA 136  < > 134*  --  138  K 4.4  < > 4.3  --  4.4  CL 102  < > 113*  --  111  CO2 25  --   --   --  18*  GLUCOSE 90  < > 156*  --  97  BUN 35*  < > 26*  --  26*  CREATININE 1.45*  < > 1.10 1.36* 1.31  CALCIUM 8.4  --   --   --  8.4  < > = values in this interval not displayed.  PT/INR:  Recent Labs  09/24/2014 1510    LABPROT 18.2*  INR 1.50*   ABG    Component Value Date/Time   PHART 7.449 08/30/2014 0139   PCO2ART 29.4* 08/30/2014 0139   PO2ART 108.0* 08/30/2014 0139   HCO3 20.5 08/30/2014 0139   TCO2 21 08/30/2014 0139   ACIDBASEDEF 3.0* 08/30/2014 0139   O2SAT 99.0 08/30/2014 0139     Radiology: Dg Chest Port 1 View  09/03/2014   CLINICAL DATA:  Hypoxia  EXAM: PORTABLE CHEST - 1 VIEW  COMPARISON:  Study obtained earlier in the day  FINDINGS: Endotracheal tube tip is 4.6 cm above the carina. Swan-Ganz catheter tip is in the right main pulmonary artery. There is a left chest tube and a mediastinal drain. Nasogastric tube tip and side port are below the diaphragm. No pneumothorax. Lungs are clear. Heart is upper normal in size with pulmonary vascularity within normal limits. Patient is status post coronary artery bypass grafting and mitral valve replacement.  IMPRESSION: Tube and catheter positions as described. No pneumothorax. Lungs clear.   Electronically Signed   By: Bretta Bang III M.D.   On: 09/26/2014 15:30   Dg Chest Portable 1 View  09/05/2014   CLINICAL DATA:  Incorrect needle count. Evaluate for retained foreign body.  EXAM: PORTABLE CHEST - 1 VIEW  COMPARISON:  08/28/2014 chest radiograph.  FINDINGS: Cardiomegaly. New postoperative changes of CABG. Endotracheal tube is present with the tip 6.2 cm from the carina. RIGHT IJ vascular sheath transmits a Swan-Ganz catheter with the tip in the RIGHT pulmonary artery. Pulmonary vascular congestion is present without airspace consolidation. Mediastinal drain and LEFT thoracostomy tube. Soft tissue emphysema extends along the RIGHT chest wall and in the thoracic inlet.  IMPRESSION: 1. Support apparatus in good position. 2. Expected appearance of the chest following CABG. 3. No retained surgical foreign body is identified. 4. These results were called by telephone at the time of interpretation on 09/21/2014 at 2:52 pm to Benay Spice , who verbally  acknowledged these results.   Electronically Signed   By: Andreas Newport M.D.   On: 09/13/2014 14:53     Assessment/Plan: S/P Procedure(s) (LRB): CORONARY ARTERY BYPASS GRAFTING (CABG), ON PUMP, TIMES FOUR, USING LEFT INTERNAL MAMMARY ARTERY, RIGHT GREATER SAPHENOUS VEIN HARVESTED ENDOSCOPICALLY (N/A) MITRAL VALVE REPAIR (MVR) (N/A) TRANSESOPHAGEAL ECHOCARDIOGRAM (TEE) (N/A)  1 stable overall with adeq CI 2 Conts milrinone at current dose begin to wean dopamine,  give lasix as well For volume overload, CHFon CXR 3 hold on lovenox for now with thrombocytopenia 4 expected ABL anemia- stable 5 renal fxn stable- monitor closely 6 d/c chest tubes 7 d/c femoral a line 8 see orders    Suheyla Mortellaro E 08/30/2014 7:30 AM

## 2014-08-30 NOTE — Op Note (Signed)
NAMETEAL, BONTRAGER NO.:  [id number]  MEDICAL RECORD NO.:  [id number]  LOCATION:  2S13C                        FACILITY:  [facility]  PHYSICIAN:  Salvatore Decent. Dorris Fetch, M.D.DATE OF BIRTH:  1941/02/05  DATE OF PROCEDURE:  09/21/14 DATE OF DISCHARGE:                              OPERATIVE REPORT   PREOPERATIVE DIAGNOSES:  Severe three-vessel coronary artery disease and severe mitral regurgitation.  POSTOPERATIVE DIAGNOSES:  Severe three-vessel coronary artery disease and severe mitral regurgitation.  PROCEDURE:  Median sternotomy, extracorporeal circulation, coronary artery bypass grafting x4 (left internal mammary artery to left anterior descending, saphenous vein graft to posterior descending, saphenous vein graft to ramus intermedius, saphenous vein graft to obtuse marginal 1) complex mitral valve repair with triangular resection of posterior leaflet and ring annuloplasty with a Soren memo 3D 32 mm ring, reference #[id number], serial #[id number]) endoscopic vein harvest, right leg.  SURGEON:  Salvatore Decent. Dorris Fetch, M.D.  ASSISTANT:  Rowe Clack, PA-C.  ANESTHESIA:  General.  FINDINGS:  Prebypass transesophageal echocardiography revealed severe MR with a flail P2 leaflet, moderate tricuspid regurgitation and moderate left ventricular dysfunction.  There were good quality targets and good quality conduits.  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  Postbypass transesophageal echocardiography revealed no residual mitral regurgitation and mild tricuspid regurgitation.  There was septal dyskinesis consistent with DDD pacing.  CLINICAL NOTE:  Darren [name] is a 74 year old gentleman, who presented with class 4 congestive heart failure.  His BNP and troponin were elevated.  An echocardiogram showed severe mitral regurgitation  with posterior leaflet prolapse.  His ejection fraction was 30-35%.  At cardiac catheterization, he had moderate pulmonary hypertension and severe three-vessel coronary artery disease.  He was referred for surgical evaluation. Although high risk he was felt to be a surgical candidate.  The indications, risks, benefits, and alternatives were discussed in detail with the patient and are outlined in the consultation note.  He understood and accepted the risks and agreed to proceed.  OPERATIVE NOTE:  Mr. Sally was brought to the preoperative holding area on 09/21/2014. There anesthesia placed a Swan-Ganz catheter and an arterial blood pressure monitoring line.  He was taken to the operating room, anesthetized, and intubated.  A Foley catheter was placed.  Intravenous antibiotics were administered.  Transesophageal echocardiography was performed by Dr. Sharee Holster of Anesthesia. It revealed severe mitral regurgitation with a flail P2 leaflet and enlarged left atrium, moderate tricuspid regurgitation.  There was left ventricular hypertrophy with moderate left ventricular dysfunction.  The chest, abdomen, and legs were prepped and draped in the usual sterile fashion.  A median sternotomy was performed.  There was sternal osteoporosis.  The left internal mammary artery was harvested using standard technique. Simultaneously, an incision was made in the medial aspect of the right leg at the level of the knee.  The greater saphenous vein was harvested from the groin to the upper calf.  Both the saphenous vein and the left mammary were good quality conduits.  2000 units of heparin was administered during the vessel harvest.  The  remainder of the full heparin dose was given prior to opening the pericardium.  While taking down the mammary, a left pleural effusion was drained. This was approximately 900 mL of clear serous fluid.  After taking down the left mammary, the right pleural space  was opened and an 800 mL effusion was drained from the right side.  The pericardium was opened.  There was a small pericardial effusion.  It was also serous.  The ascending aorta was inspected.  There was no evidence of atherosclerotic disease.  The aorta was cannulated via concentric 2-0 Ethibond pledgeted pursestring sutures.  A 31-French metal tip right-angle venous cannula was placed via pursestring suture in the superior vena cava.  After confirming adequate anticoagulation with ACT measurement, cardiopulmonary bypass was begun.  A pursestring suture then was placed in the inferior aspect of the right atrium and a 36-French malleable venous cannula was placed via this pursestring into the inferior vena cava.  Caval tapes were placed, but only tightened during the open portion of the procedure.  This line was connected to the bypass circuit and a full flow was continued.  The patient was cooled to 28 degrees Celsius.  Flows were maintained per protocol.  The coronary arteries were inspected and anastomotic sites were chosen.  The conduits were inspected and cut to length.  A retrograde cardioplegic cannula was placed via pursestring suture in the right atrium and directed in the coronary sinus.  A foam pad was placed in the pericardium to insulate the heart.  A temperature probe was placed in the myocardial septum and a cardioplegic cannula placed in the ascending aorta.  Carbon dioxide was insufflated into the operative field.  The aorta was crossclamped.  The left ventricle was emptied via the aortic root vent.  Cardiac arrest then was achieved with a combination of cold antegrade and retrograde blood cardioplegia and topical iced saline.  An initial 500 mL of cardioplegia was given antegrade.  There was a rapid diastolic arrest and additional 500 mL was given retrograde. There was myocardial septal cooling to 9 degrees Celsius.  The following distal anastomoses were  performed.  First, a reversed saphenous vein graft was placed end-to-side to the posterior descending branch of the right coronary.  This was a 1.5 mm good quality target vessel.  The vein was anastomosed end-to-side with a running 7-0 Prolene suture.  All anastomoses were probed proximally and distally at their completion prior to tying the suture.  At the completion of each vein graft, cardioplegia was administered down the vein to assess flow and hemostasis.  Both were good for this graft.  Next, a reversed saphenous vein graft was placed end-to-side to obtuse marginal 1.  This was a dominant lateral branch.  It was a 1.5 mm good quality target vessel.  The vein was of good quality.  It was then anastomosed end-to-side with running 7-0 Prolene suture.  Again, there was good flow and good hemostasis with cardioplegia administration.  Next, the ramus intermedius was dissected out.  It was intramyocardial. It was a 1.5 mm good quality target.  The reversed saphenous vein was anastomosed end-to-side with a running 7-0 Prolene suture.  Again, there was good flow and good hemostasis.  Additional cardioplegia then was administered retrograde as well as down the vein grafts.  The left internal mammary artery was brought through a window in the pericardium.  The distal end was beveled.  It was then anastomosed end-to-side to the distal LAD.  The LAD  was intramyocardial proximal to the anastomosis.  It was grafted just where it became an epicardial vessel once again. It was a 2 mm good quality target at that site.  The mammary was a 2 mm good quality conduit.  An end-to-side anastomosis was performed with a running 8-0 Prolene suture.  At the completion of the mammary to LAD anastomosis, the bulldog clamp was briefly removed.  Rapid septal rewarming was noted.  The bulldog clamp was replaced and the mammary pedicle was tacked to the epicardial surface of the heart with 6-0 Prolene  sutures.  Additional retrograde cardioplegia was administered at this point  and then given at 15-20 minute intervals during the open portion of the procedure.  The caval tapes were tightened.  Waterson's groove was dissected out.  A left atriotomy was performed and the mitral retractor was placed.  There were changes on the endocardial surface of the septal wall of the left atrium consistent with chronic changes due to a jet of mitral insufficiency.  The valve was inspected.  There was a degenerative prolapse of P2 with a ruptured cord with a flail segment. P1 and P3 were normal as was the anterior leaflet.  A triangular resection was performed of the flail portion of the P2 segment and then figure-of-eight 4-0 Ethibond sutures were used to close the resulting defect.  After closure of the defect, the P2 segment was no longer prolapsing.  Iced saline was used to distend the left ventricle and there was minimal leakage from the valve.  The annulus sized for a 32 mm memo 3D annuloplasty ring.  2-0 Ethibond horizontal mattress sutures were placed circumferentially around the annulus.  A total of 12 sutures were utilized.  After placing the sutures, both the commissural distance and anterior leaflet sized for a 32 mm ring.  The sutures were passed through the sewing cuff of the ring which was lowered into place and then the sutures were sequentially tied.  After placement of the ring, the left ventricle was distended with iced saline and there was no residual mitral regurgitation.  Systemic rewarming was begun.  The left atriotomy was closed in 2 layers with a running 4-0 Prolene horizontal mattress suture followed by a running 4-0 Prolene simple suture.  After completing the first layer, but before tying the suture, de-airing was performed.  The cardioplegic cannula then was removed from the ascending aorta.  The vein grafts were cut to length.  The proximal vein graft anastomoses were  performed to 4.5 mm punch aortotomies with running 6-0 Prolene sutures.  At the completion of the final proximal anastomosis, the patient was placed in Trendelenburg position.  500 mL of warm retrograde cardioplegia was administered.  Extensive de-airing maneuvers were performed and the aortic crossclamp was removed.  The total crossclamp time was 142 minutes.  The patient was initially in heart block, but subsequently resumed sinus rhythm.  A dopamine infusion had been initiated at the beginning of the procedure by anesthesia.  The patient was now loaded with milrinone. All proximal and distal anastomoses and the left atriotomy were inspected for hemostasis.  Epicardial pacing wires were placed on the right ventricle and right atrium and DDD pacing was initiated at 80 beats per minute.  The retrograde cardioplegic cannula was removed.  The superior vena caval cannula was redirected inferiorly into the right atrium.  The inferior vena caval cannula was removed.  As preparation began to wean from bypass, the heart was allowed to fill with blood  and started ejecting.  The lungs were inflated.  Inspection with transesophageal echocardiography revealed some residual air.  A 16 EKG Angiocath was placed into the left ventricular apex to remove all the residual air. This site then was repaired with a 6-0 Prolene suture.  When the patient had reached a core temperature of 37 degrees Celsius, he was weaned from cardiopulmonary bypass on the first attempt. He was DDD paced at 80 beats per minute with dopamine at 5 mcg/kg/minute and milrinone at 0.3 mcg/kg/minute.  The total bypass time was 202 minutes.  Initially, there was difficulty with the cardiac output machine not giving a number, but transesophageal echocardiography revealed no residual mitral regurgitation.  There was only mild tricuspid regurgitation.  There was no change in left ventricular function,  other than some septal dyskinesis  consistent with ventricular pacing. Subsequently, the cardiac output monitor began working and the cardiac index was 1.8 L/minute per m2.  A test dose of protamine was administered.  The superior vena caval and aortic cannulae were removed.  Remainder of the protamine was administered without incident.  The chest was irrigated with a warm saline.  Hemostasis was achieved.  The pericardium was reapproximated with interrupted 3-0 silk sutures.  It came together without tension and without kinking the underlying grafts.  The left pleural and 2 mediastinal chest tubes were placed in a separate subcostal incisions and secured with #1 silk sutures.  The sternum was closed with a combination of single and double heavy gauge stainless steel wires.  The pectoralis fascia, subcutaneous tissue, and skin were closed in standard fashion.  All sponge, needle, and instrument counts were correct. However, there was an incorrect needle count with a missing C1 needle. A chest x-ray was done in the operating room, which showed no retained foreign body.  The patient then was taken from the operating room to the surgical intensive care unit in good condition.     Salvatore DecentSteven C. Dorris FetchHendrickson, M.D.     SCH/MEDQ  D:  09/26/2014  T:  08/30/2014  Job:  [id number]

## 2014-08-30 NOTE — Progress Notes (Signed)
Patient examined and record reviewed.Hemodynamics stable,labs satisfactory.Patient had stable day.Continue current care.  Continue low-dose milrinone and dopamine for MR repair Maintaining sinus rhythm so far VAN TRIGT III,PETER 08/30/2014

## 2014-08-30 NOTE — Op Note (Signed)
[name], [name] NO.:  [id number]  MEDICAL RECORD NO.:  [id number]  LOCATION:                                 FACILITY:  PHYSICIAN:  Darren [name], M.D.DATE OF BIRTH:  10-Jun-1941  DATE OF PROCEDURE:  Sep 20, 2014 DATE OF DISCHARGE:                              OPERATIVE REPORT   INTRAOPERATIVE TRANSESOPHAGEAL ECHOCARDIOGRAPHIC REPORT  INDICATIONS FOR PROCEDURE:  Darren [name] is a 74 year old patient of Darren [name] who presents today for coronary artery bypass grafting and probable mitral valve repair to be performed by Darren [name]. He was brought to the holding area the morning of surgery where under local anesthesia with sedation, pulmonary artery and radial arterial lines were placed.  He was then transferred to the OR for routine induction of general anesthesia, after which the TEE probe is prepared, then passed oropharyngeally into the stomach for imaging of the cardiac structures.  PRECARDIOPULMONARY BYPASS TEE EXAMINATION:  Left ventricle:  The left ventricular chamber is seen initially in the short axis view.  It is moderately dilated, thin walled chamber.  There is mild-to-moderate left ventricular chamber dysfunction.  All segmental walls are hypocontractile.  Of note, the septal wall is flattened.  The estimated ejection fraction is 30%.  Both long and short axis views are obtained.  Mitral valve:  The mitral valve is initially seen in the four-chamber view.  Both anterior and posterior leaflets are thickened and degenerative.  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  On color Doppler, this is associated with a large eccentric jet of mitral regurgitant flow.  The mitral regurgitant flow is layered over the top of the anterior leaflet along the inner portion of the  interatrial septum, well into the much enlarged left atrial chamber.  The jet itself appears to wrap completely around the left atrial chamber.  Multiple views are obtained as well as 3D imaging is performed on the mitral valve.  This does appear to show that this segment is essentially P2 segment that is severely prolapsed or ruptured at this level.  Aortic valve:  Aortic valve is seen initially in the short axis view. There is mild aortic sclerosis appreciated.  There are 3 cusps.  There is limited excursion of the noncoronary cusps.  There is no associated obstruction to flow, therefore, no aortic stenosis.  On long-axis views, there is essentially no aortic insufficiency that is appreciated.  Right ventricle:  Right ventricular chamber is also viewed in the four- chamber view.  Of note, all chambers are dilated.  Right atrium, left atrium, right ventricle, and the left ventricle are all dilated chambers in this patient.  Of note, the right ventricular chamber nears the apex of the left ventricle itself.  The free wall is contractile, but moderately hypocontractile.  Tricuspid valve:  This is a thin compliant mobile tricuspid apparatus. On color Doppler, there is 1+ tricuspid regurgitant flow appreciated.  A pulmonary artery catheter is seen across this valve.  Right atrium:  The right atrial chamber is moderately enlarged.  There is  a large filiform object within the right atrial chamber itself.  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  There is a left to right bowing of the interatrial septum itself.  The patient was placed on cardiopulmonary bypass, coronary artery bypass grafting is carried out.  De-airing maneuvers were also carried out after ring annuloplasty repair.  The patient was then separated from cardiopulmonary bypass with the initial attempt.  POST CARDIOPULMONARY BYPASS TEE EXAMINATION:  (Limited exam)  Mitral valve:  In the four-chamber view, we are able to  visualize with ease the ring annuloplasty repair in the mitral position.  It appears well seated and in an appropriate fashion.  Color Doppler and multiple views that shows essentially no regurgitant flow across this ring annuloplasty repair of the mitral valve.  Tricuspid valve:  The tricuspid valve was then viewed.  There is minimal to trace tricuspid regurgitant flow appreciated in the post bypass period.  Left ventricle:  The left ventricular chamber function is significantly diminished in the early post bypass.  All segmental wall thickened minimally but they are significantly hypocontractile.  Of note, in just as before, there is a flattened septum of the left ventricular chamber that is appreciated.  With time and separation from cardiopulmonary bypass, there is some increased contractile function that is appreciated and the ventricle does appear slightly more vigorous 15 to 20 minutes after separation from cardiopulmonary bypass and after the addition of inotropes.  Right ventricle:  The right ventricular chamber remains dilated, mildly hypocontractile.  Overall, the patient did well and was able to return to the cardiac intensive care unit in stable condition.          ______________________________ Darren [name], M.D.     JTM/MEDQ  D:  Oct 01, 2014  T:  08/30/2014  Job:  [id number]

## 2014-08-31 ENCOUNTER — Inpatient Hospital Stay (HOSPITAL_COMMUNITY): Payer: Medicare Other

## 2014-08-31 LAB — GLUCOSE, CAPILLARY
GLUCOSE-CAPILLARY: 125 mg/dL — AB (ref 70–99)
GLUCOSE-CAPILLARY: 129 mg/dL — AB (ref 70–99)
GLUCOSE-CAPILLARY: 63 mg/dL — AB (ref 70–99)
GLUCOSE-CAPILLARY: 83 mg/dL (ref 70–99)
GLUCOSE-CAPILLARY: 84 mg/dL (ref 70–99)
Glucose-Capillary: 113 mg/dL — ABNORMAL HIGH (ref 70–99)
Glucose-Capillary: 92 mg/dL (ref 70–99)

## 2014-08-31 LAB — BASIC METABOLIC PANEL
ANION GAP: 10 (ref 5–15)
Anion gap: 6 (ref 5–15)
BUN: 28 mg/dL — ABNORMAL HIGH (ref 6–23)
BUN: 32 mg/dL — ABNORMAL HIGH (ref 6–23)
CO2: 19 mmol/L (ref 19–32)
CO2: 25 mmol/L (ref 19–32)
Calcium: 9.2 mg/dL (ref 8.4–10.5)
Calcium: 9 mg/dL (ref 8.4–10.5)
Chloride: 106 mmol/L (ref 96–112)
Chloride: 104 mmol/L (ref 96–112)
Creatinine, Ser: 1.53 mg/dL — ABNORMAL HIGH (ref 0.50–1.35)
Creatinine, Ser: 1.69 mg/dL — ABNORMAL HIGH (ref 0.50–1.35)
GFR calc Af Amer: 50 mL/min — ABNORMAL LOW (ref >90)
GFR calc Af Amer: 45 mL/min — ABNORMAL LOW (ref >90)
GFR calc non Af Amer: 43 mL/min — ABNORMAL LOW (ref >90)
GFR calc non Af Amer: 38 mL/min — ABNORMAL LOW (ref >90)
GLUCOSE: 69 mg/dL — AB (ref 70–99)
Glucose, Bld: 95 mg/dL (ref 70–99)
Potassium: 3.9 mmol/L (ref 3.5–5.1)
Potassium: 4 mmol/L (ref 3.5–5.1)
Sodium: 135 mmol/L (ref 135–145)
Sodium: 135 mmol/L (ref 135–145)

## 2014-08-31 LAB — CBC
HCT: 29.8 % — ABNORMAL LOW (ref 39.0–52.0)
HCT: 32 % — ABNORMAL LOW (ref 39.0–52.0)
HEMOGLOBIN: 10.3 g/dL — AB (ref 13.0–17.0)
Hemoglobin: 11.2 g/dL — ABNORMAL LOW (ref 13.0–17.0)
MCH: 30.4 pg (ref 26.0–34.0)
MCH: 30.7 pg (ref 26.0–34.0)
MCHC: 34.6 g/dL (ref 30.0–36.0)
MCHC: 35 g/dL (ref 30.0–36.0)
MCV: 87.9 fL (ref 78.0–100.0)
MCV: 87.7 fL (ref 78.0–100.0)
PLATELETS: 61 10*3/uL — AB (ref 150–400)
Platelets: 45 10*3/uL — ABNORMAL LOW (ref 150–400)
RBC: 3.39 MIL/uL — ABNORMAL LOW (ref 4.22–5.81)
RBC: 3.65 MIL/uL — ABNORMAL LOW (ref 4.22–5.81)
RDW: 15.7 % — ABNORMAL HIGH (ref 11.5–15.5)
RDW: 15.5 % (ref 11.5–15.5)
WBC: 8.2 10*3/uL (ref 4.0–10.5)
WBC: 9.6 10*3/uL (ref 4.0–10.5)

## 2014-08-31 LAB — CARBOXYHEMOGLOBIN
Carboxyhemoglobin: 1.2 % (ref 0.5–1.5)
Methemoglobin: 0.9 % (ref 0.0–1.5)
O2 Saturation: 69 %
Total hemoglobin: 10.4 g/dL — ABNORMAL LOW (ref 13.5–18.0)

## 2014-08-31 MED ORDER — BUDESONIDE-FORMOTEROL FUMARATE 160-4.5 MCG/ACT IN AERO
2.0000 | INHALATION_SPRAY | Freq: Two times a day (BID) | RESPIRATORY_TRACT | Status: DC
  Administered 2014-08-31 – 2014-09-12 (×20): 2 via RESPIRATORY_TRACT
  Filled 2014-08-31 (×2): qty 6

## 2014-08-31 MED ORDER — IPRATROPIUM-ALBUTEROL 0.5-2.5 (3) MG/3ML IN SOLN
3.0000 mL | RESPIRATORY_TRACT | Status: DC
  Administered 2014-08-31 – 2014-09-02 (×11): 3 mL via RESPIRATORY_TRACT
  Filled 2014-08-31 (×11): qty 3

## 2014-08-31 MED ORDER — MORPHINE SULFATE 2 MG/ML IJ SOLN: 2.0000 mg | INTRAMUSCULAR | Status: DC | PRN

## 2014-08-31 MED ORDER — FUROSEMIDE 10 MG/ML IJ SOLN
20.0000 mg | Freq: Two times a day (BID) | INTRAMUSCULAR | Status: DC
  Administered 2014-08-31 – 2014-09-03 (×8): 20 mg via INTRAVENOUS
  Filled 2014-08-31 (×11): qty 2

## 2014-08-31 MED ORDER — THIAMINE HCL 100 MG/ML IJ SOLN
100.0000 mg | Freq: Every day | INTRAMUSCULAR | Status: DC
  Administered 2014-08-31 – 2014-09-03 (×4): 100 mg via INTRAVENOUS
  Filled 2014-08-31 (×4): qty 1

## 2014-08-31 MED ORDER — FUROSEMIDE 10 MG/ML IJ SOLN
20.0000 mg | Freq: Once | INTRAMUSCULAR | Status: AC
  Administered 2014-08-31: 20 mg via INTRAVENOUS
  Filled 2014-08-31: qty 2

## 2014-08-31 NOTE — Progress Notes (Signed)
Hypoglycemic Event  CBG: 0425 63  Treatment: 4oz Orange Juice  Symptoms: none  Follow-up CBG: Time:0508 CBG Result:83  Possible Reasons for Event: inadequate intake  Comments/MD notified:    Ponciano OrtHardy, Leng Montesdeoca Rae  Remember to initiate Hypoglycemia Order Set & complete

## 2014-08-31 NOTE — Progress Notes (Signed)
CT surgery p.m. 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

## 2014-08-31 NOTE — Progress Notes (Signed)
2 Days Post-Op Procedure(s) (LRB): CORONARY ARTERY BYPASS GRAFTING (CABG), ON PUMP, TIMES FOUR, USING LEFT INTERNAL MAMMARY ARTERY, RIGHT GREATER SAPHENOUS VEIN HARVESTED ENDOSCOPICALLY (N/A) MITRAL VALVE REPAIR (MVR) (N/A) TRANSESOPHAGEAL ECHOCARDIOGRAM (TEE) (N/A) Subjective: Status post CABG with mitral valve repair Blood pressure improved, slowly weaning press orders Maintaining sinus rhythm Chest x-ray today shows mild right pleural effusion-we'll start IV Lasix We'll mobilize patient out of bed, removed PA catheter Patient is weak and will need physical therapy  Objective: Vital signs in last 24 hours: Temp:  [96.3 F (35.7 C)-99.6 F (37.6 C)] 97.6 F (36.4 C) (03/05 1100) Pulse Rate:  [43-92] 90 (03/05 1300) Cardiac Rhythm:  [-] Atrial paced (03/05 0721) Resp:  [8-26] 14 (03/05 1300) BP: (92-109)/(54-81) 100/72 mmHg (03/05 1300) SpO2:  [94 %-100 %] 95 % (03/05 1300) Arterial Line BP: (90-129)/(51-77) 102/51 mmHg (03/05 0300) Weight:  [146 lb 2.6 oz (66.3 kg)] 146 lb 2.6 oz (66.3 kg) (03/05 0400)  Hemodynamic parameters for last 24 hours: PAP: (29-49)/(14-30) 29/18 mmHg CO:  [2.3 L/min-4.2 L/min] 3.3 L/min CI:  [1.4 L/min/m2-2.5 L/min/m2] 1.9 L/min/m2  Intake/Output from previous day: 03/04 0701 - 03/05 0700 In: 2320.5 [P.O.:600; I.V.:1120.5; IV Piggyback:600] Out: 1725 [Urine:1675; Chest Tube:50] Intake/Output this shift: Total I/O In: 434.5 [P.O.:300; I.V.:134.5] Out: 395 [Urine:395]  Patient mildly confused but cooperative no focal motor deficit Lungs slightly diminished Abdomen soft Extremities warm No focal weakness  Lab Results:  Recent Labs  08/30/14 1700 08/30/14 1709 08/31/14 0437  WBC 7.9  --  8.2  HGB 10.3* 11.2* 10.3*  HCT 30.2* 33.0* 29.8*  PLT 78*  --  61*   BMET:  Recent Labs  08/30/14 0429  08/30/14 1709 08/31/14 0437  NA 138  --  135 135  K 4.4  --  4.3 3.9  CL 111  --  105 106  CO2 18*  --   --  19  GLUCOSE 97  --  175* 69*   BUN 26*  --  33* 28*  CREATININE 1.31  < > 1.40* 1.53*  CALCIUM 8.4  --   --  9.2  < > = values in this interval not displayed.  PT/INR:  Recent Labs  04-03-15 1510  LABPROT 18.2*  INR 1.50*   ABG    Component Value Date/Time   PHART 7.449 08/30/2014 0139   HCO3 20.5 08/30/2014 0139   TCO2 17 08/30/2014 1709   ACIDBASEDEF 3.0* 08/30/2014 0139   O2SAT 69.0 08/31/2014 0417   CBG (last 3)   Recent Labs  08/31/14 0508 08/31/14 0754 08/31/14 1138  GLUCAP 83 113* 125*    Assessment/Plan: S/P Procedure(s) (LRB): CORONARY ARTERY BYPASS GRAFTING (CABG), ON PUMP, TIMES FOUR, USING LEFT INTERNAL MAMMARY ARTERY, RIGHT GREATER SAPHENOUS VEIN HARVESTED ENDOSCOPICALLY (N/A) MITRAL VALVE REPAIR (MVR) (N/A) TRANSESOPHAGEAL ECHOCARDIOGRAM (TEE) (N/A) Continue with slow wean of milrinone-dopamine Creatinine elevated 1.5, platelets reduced 65,000-follow Mobilize out of bed and start physical therapy Continue a pacing Not a good Coumadin candidate unless he develops persistent atrial fibrillation   LOS: 11 days    VAN TRIGT III,PETER 08/31/2014

## 2014-09-01 ENCOUNTER — Inpatient Hospital Stay (HOSPITAL_COMMUNITY): Payer: Medicare Other

## 2014-09-01 LAB — TYPE AND SCREEN
ABO/RH(D): A POS
ANTIBODY SCREEN: NEGATIVE
UNIT DIVISION: 0
UNIT DIVISION: 0
UNIT DIVISION: 0
Unit division: 0
Unit division: 0
Unit division: 0

## 2014-09-01 LAB — CBC
HCT: 32.2 % — ABNORMAL LOW (ref 39.0–52.0)
Hemoglobin: 11.2 g/dL — ABNORMAL LOW (ref 13.0–17.0)
MCH: 30.5 pg (ref 26.0–34.0)
MCHC: 34.8 g/dL (ref 30.0–36.0)
MCV: 87.7 fL (ref 78.0–100.0)
Platelets: 52 10*3/uL — ABNORMAL LOW (ref 150–400)
RBC: 3.67 MIL/uL — ABNORMAL LOW (ref 4.22–5.81)
RDW: 15.4 % (ref 11.5–15.5)
WBC: 8.5 10*3/uL (ref 4.0–10.5)

## 2014-09-01 LAB — BASIC METABOLIC PANEL
Anion gap: 9 (ref 5–15)
BUN: 33 mg/dL — ABNORMAL HIGH (ref 6–23)
CO2: 20 mmol/L (ref 19–32)
Calcium: 8.5 mg/dL (ref 8.4–10.5)
Chloride: 104 mmol/L (ref 96–112)
Creatinine, Ser: 1.73 mg/dL — ABNORMAL HIGH (ref 0.50–1.35)
GFR calc Af Amer: 43 mL/min — ABNORMAL LOW (ref >90)
GFR calc non Af Amer: 37 mL/min — ABNORMAL LOW (ref >90)
Glucose, Bld: 103 mg/dL — ABNORMAL HIGH (ref 70–99)
Potassium: 4.1 mmol/L (ref 3.5–5.1)
Sodium: 133 mmol/L — ABNORMAL LOW (ref 135–145)

## 2014-09-01 LAB — GLUCOSE, CAPILLARY
GLUCOSE-CAPILLARY: 142 mg/dL — AB (ref 70–99)
Glucose-Capillary: 108 mg/dL — ABNORMAL HIGH (ref 70–99)
Glucose-Capillary: 112 mg/dL — ABNORMAL HIGH (ref 70–99)
Glucose-Capillary: 142 mg/dL — ABNORMAL HIGH (ref 70–99)
Glucose-Capillary: 88 mg/dL (ref 70–99)

## 2014-09-01 NOTE — Evaluation (Signed)
Physical Therapy Re-Evaluation Patient Details Name: Darren [name] MRN: [id number] DOB: 06-18-1941 Today's Date: 09/01/2014   History of Present Illness  72M smoker with HTN, HLD, GERD, CVA, CKD (baseline Cr 1.6), with L3 compression fracture s/p kyphoplasty on 2/10and history of MR who presents with new onset CHF and NSTEMI. 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 Presents with functional decline following this procedure (see below.) Currently requires mod assist for safe mobility, and demonstrates limited activity tolerance due to pain and fatigue, but is motivated to work with therapy. Goals updated appropriately. Previously independent with an assistive device at home. Patient will continue to benefit from skilled physical therapy services to further improve independence with functional mobility.     Follow Up Recommendations CIR (Will require SNF if he cannot tolerate CIR)    Equipment Recommendations  None recommended by PT    Recommendations for Other Services Rehab consult;OT consult     Precautions / Restrictions Precautions Precautions: None;Sternal Precaution Comments: left foot drop with fatigue Restrictions Weight Bearing Restrictions: Yes Other Position/Activity Restrictions: Sternal      Mobility  Bed Mobility               General bed mobility comments: In chair at start of therapy  Transfers Overall transfer level: Needs assistance Equipment used: Ambulation equipment used (Steady) Transfers: Sit to/from Stand Sit to Stand: Mod assist;From elevated surface         General transfer comment: Mod assist for boost to stand from recliner with pillows in seat for elevation. Max cues (tactile and verbal) to maintain sternal precautions with safe hand placement. No buckling noted upon standing.   Ambulation/Gait                Stairs            Wheelchair Mobility     Modified Rankin (Stroke Patients Only)       Balance Overall balance assessment: Needs assistance Sitting-balance support: No upper extremity supported;Feet supported Sitting balance-Leahy Scale: Fair     Standing balance support: Bilateral upper extremity supported Standing balance-Leahy Scale: Poor Standing balance comment: Stood for approximately 45 seconds with cues for safe hand placement to maintain sternal precautions. Did not need to utilize knee block on steady but did require min-mod assist for upright posture.                             Pertinent Vitals/Pain Pain Assessment: Faces Faces Pain Scale: Hurts little more Pain Location: sternum Pain Intervention(s): Monitored during session;Repositioned;Limited activity within patient's tolerance    Home Living Family/patient expects to be discharged to:: Unsure Living Arrangements: Children Available Help at Discharge: Family;Available 24 hours/day Type of Home: House Home Access: Stairs to enter Entrance Stairs-Rails: Right;Left;Can reach both Entrance Stairs-Number of Steps: 4 Home Layout: One level Home Equipment: Walker - 2 wheels;Shower seat      Prior Function Level of Independence: Independent with assistive device(s)         Comments: walker at baseline     Hand Dominance        Extremity/Trunk Assessment   Upper Extremity Assessment: Defer to OT evaluation           Lower Extremity Assessment: Generalized weakness;Difficult to assess due to impaired cognition (Hx of stroke with residual LLE weakness)         Communication   Communication: No  difficulties  Cognition Arousal/Alertness: Awake/alert Behavior During Therapy: WFL for tasks assessed/performed Overall Cognitive Status: Impaired/Different from baseline Area of Impairment: Following commands;Safety/judgement;Problem solving     Memory: Decreased recall of precautions;Decreased short-term memory Following  Commands: Follows one step commands inconsistently Safety/Judgement: Decreased awareness of safety;Decreased awareness of deficits   Problem Solving: Slow processing;Difficulty sequencing;Requires tactile cues;Requires verbal cues      General Comments General comments (skin integrity, edema, etc.): HR 90, SpO2 100% on 2L supplemental O2, mid 90s on room air, BP 101/61.    Exercises General Exercises - Lower Extremity Ankle Circles/Pumps: AROM;Both;10 reps;Seated Long Arc Quad: Strengthening;Both;10 reps;Seated;AAROM Hip Flexion/Marching: AAROM;Strengthening;Both;10 reps;Seated      Assessment/Plan    PT Assessment Patient needs continued PT services  PT Diagnosis Difficulty walking;Generalized weakness;Acute pain   PT Problem List Decreased strength;Decreased range of motion;Decreased activity tolerance;Decreased balance;Decreased mobility;Decreased cognition;Decreased knowledge of use of DME;Decreased safety awareness;Decreased knowledge of precautions;Cardiopulmonary status limiting activity;Pain  PT Treatment Interventions DME instruction;Gait training;Stair training;Functional mobility training;Therapeutic activities;Therapeutic exercise;Balance training;Neuromuscular re-education;Cognitive remediation;Patient/family education;Modalities   PT Goals (Current goals can be found in the Care Plan section) Acute Rehab PT Goals Patient Stated Goal: None stated PT Goal Formulation: With patient Time For Goal Achievement: 09/15/14 Potential to Achieve Goals: Good    Frequency Min 3X/week   Barriers to discharge        Co-evaluation               End of Session Equipment Utilized During Treatment: Gait belt;Oxygen Activity Tolerance: Patient limited by fatigue Patient left: in chair;with call bell/phone within reach;with chair alarm set;with nursing/sitter in room Nurse Communication: Mobility status         Time: [id number] PT Time Calculation (min) (ACUTE ONLY): 21  min   Charges:   PT Evaluation $Initial PT Evaluation Tier I: 1 Procedure     PT G CodesBerton Mount:        Anabeth Chilcott S 09/01/2014, 10:25 AM Charlsie MerlesLogan Secor Alanii Ramer, PT [phone]

## 2014-09-01 NOTE — Progress Notes (Signed)
3 Days Post-Op Procedure(s) (LRB): CORONARY ARTERY BYPASS GRAFTING (CABG), ON PUMP, TIMES FOUR, USING LEFT INTERNAL MAMMARY ARTERY, RIGHT GREATER SAPHENOUS VEIN HARVESTED ENDOSCOPICALLY (N/A) MITRAL VALVE REPAIR (MVR) (N/A) TRANSESOPHAGEAL ECHOCARDIOGRAM (TEE) (N/A) Subjective: Patient stable but requires assistance to get out of bed Very deconditioned plus left-sided weakness from old stroke Blood pressure stable, atrially paced. Milrinone off Creatinine slowly increased to 1.6-continue renal dopamine Chest x-ray shows mild atelectasis-effusion Continue with physical therapy and careful diuresis  Objective: Vital signs in last 24 hours: Temp:  [97.8 F (36.6 C)-98.3 F (36.8 C)] 98.3 F (36.8 C) (03/06 1500) Pulse Rate:  [89-92] 89 (03/06 1800) Cardiac Rhythm:  [-] Atrial paced (03/06 0715) Resp:  [11-22] 13 (03/06 1800) BP: (85-119)/(50-91) 108/70 mmHg (03/06 1800) SpO2:  [94 %-100 %] 99 % (03/06 1800) Weight:  [144 lb 6.4 oz (65.5 kg)] 144 lb 6.4 oz (65.5 kg) (03/06 0300)  Hemodynamic parameters for last 24 hours:   stable  Intake/Output from previous day: 03/05 0701 - 03/06 0700 In: 986.2 [P.O.:540; I.V.:446.2] Out: 980 [Urine:980] Intake/Output this shift: Total I/O In: 963 [P.O.:780; I.V.:183] Out: 460 [Urine:460]  Alert and comfortable   Diminished breath sounds on the l right   abdomen soft  Lab Results:  Recent Labs  08/31/14 1700 09/01/14 0600  WBC 9.6 8.5  HGB 11.2* 11.2*  HCT 32.0* 32.2*  PLT 45* 52*   BMET:  Recent Labs  08/31/14 1700 09/01/14 0600  NA 135 133*  K 4.0 4.1  CL 104 104  CO2 25 20  GLUCOSE 95 103*  BUN 32* 33*  CREATININE 1.69* 1.73*  CALCIUM 9.0 8.5    PT/INR: No results for input(s): LABPROT, INR in the last 72 hours. ABG    Component Value Date/Time   PHART 7.449 08/30/2014 0139   HCO3 20.5 08/30/2014 0139   TCO2 17 08/30/2014 1709   ACIDBASEDEF 3.0* 08/30/2014 0139   O2SAT 69.0 08/31/2014 0417   CBG (last 3)    Recent Labs  09/01/14 0805 09/01/14 1146 09/01/14 1608  GLUCAP 108* 142* 142*    Assessment/Plan: S/P Procedure(s) (LRB): CORONARY ARTERY BYPASS GRAFTING (CABG), ON PUMP, TIMES FOUR, USING LEFT INTERNAL MAMMARY ARTERY, RIGHT GREATER SAPHENOUS VEIN HARVESTED ENDOSCOPICALLY (N/A) MITRAL VALVE REPAIR (MVR) (N/A) TRANSESOPHAGEAL ECHOCARDIOGRAM (TEE) (N/A) Continued careful diuresis   continue physical therapy Patient will need skilled nursing facility at time of discharge   LOS: 12 days    VAN TRIGT III,Maymie Brunke 09/01/2014

## 2014-09-02 ENCOUNTER — Inpatient Hospital Stay (HOSPITAL_COMMUNITY): Payer: Medicare Other

## 2014-09-02 LAB — BASIC METABOLIC PANEL
Anion gap: 9 (ref 5–15)
BUN: 38 mg/dL — ABNORMAL HIGH (ref 6–23)
CO2: 18 mmol/L — ABNORMAL LOW (ref 19–32)
Calcium: 8.1 mg/dL — ABNORMAL LOW (ref 8.4–10.5)
Chloride: 105 mmol/L (ref 96–112)
Creatinine, Ser: 1.47 mg/dL — ABNORMAL HIGH (ref 0.50–1.35)
GFR calc Af Amer: 53 mL/min — ABNORMAL LOW (ref >90)
GFR calc non Af Amer: 46 mL/min — ABNORMAL LOW (ref >90)
Glucose, Bld: 100 mg/dL — ABNORMAL HIGH (ref 70–99)
Potassium: 5.1 mmol/L (ref 3.5–5.1)
Sodium: 132 mmol/L — ABNORMAL LOW (ref 135–145)

## 2014-09-02 LAB — GLUCOSE, CAPILLARY
GLUCOSE-CAPILLARY: 108 mg/dL — AB (ref 70–99)
GLUCOSE-CAPILLARY: 94 mg/dL (ref 70–99)
Glucose-Capillary: 93 mg/dL (ref 70–99)
Glucose-Capillary: 34 mg/dL — CL (ref 70–99)
Glucose-Capillary: 115 mg/dL — ABNORMAL HIGH (ref 70–99)
Glucose-Capillary: 100 mg/dL — ABNORMAL HIGH (ref 70–99)
Glucose-Capillary: 100 mg/dL — ABNORMAL HIGH (ref 70–99)
Glucose-Capillary: 140 mg/dL — ABNORMAL HIGH (ref 70–99)
Glucose-Capillary: 78 mg/dL (ref 70–99)
Glucose-Capillary: 88 mg/dL (ref 70–99)

## 2014-09-02 LAB — CBC
HCT: 30.2 % — ABNORMAL LOW (ref 39.0–52.0)
Hemoglobin: 10.6 g/dL — ABNORMAL LOW (ref 13.0–17.0)
MCH: 30.4 pg (ref 26.0–34.0)
MCHC: 35.1 g/dL (ref 30.0–36.0)
MCV: 86.5 fL (ref 78.0–100.0)
Platelets: 59 10*3/uL — ABNORMAL LOW (ref 150–400)
RBC: 3.49 MIL/uL — ABNORMAL LOW (ref 4.22–5.81)
RDW: 15.4 % (ref 11.5–15.5)
WBC: 6.4 10*3/uL (ref 4.0–10.5)

## 2014-09-02 MED ORDER — IPRATROPIUM-ALBUTEROL 0.5-2.5 (3) MG/3ML IN SOLN
3.0000 mL | Freq: Four times a day (QID) | RESPIRATORY_TRACT | Status: DC | PRN
  Administered 2014-09-03: 3 mL via RESPIRATORY_TRACT
  Filled 2014-09-02: qty 3

## 2014-09-02 NOTE — Progress Notes (Signed)
Rehab Admissions Coordinator Note:  Patient was screened by Darren [name] for appropriateness for an Inpatient Acute Rehab Consult.  At this time, we are recommending Skilled Nursing Facility. Pt has [facility] Medicare and it is not likely that [facility] Medicare would give authorization for inpatient rehab based on pt's current diagnosis.  Darren [name] 09/02/2014, 9:08 AM  I can be reached at [phone].

## 2014-09-02 NOTE — Progress Notes (Signed)
UR Completed.  [phone]  

## 2014-09-02 NOTE — Progress Notes (Signed)
Physical Therapy Treatment Patient Details Name: Darren HarrisonRichard M Fass MRN: [id number] DOB: 09/15/1940 Today's Date: 09/02/2014    History of Present Illness 74 y.o. male smoker with HTN, HLD, GERD, CVA, CKD (baseline Cr 1.6), with L3 compression fracture s/p kyphoplasty on 2/10and history of MR who presents with new onset CHF and NSTEMI. S/p CABG 03/03    PT Comments    Patient making progress towards physical therapy goals, focusing on stand pivot transfers and gait training, ambulating up to 3 feet today with Mod assist +2 support. Limited by shortness of breath which almost appears to be a paradoxical pattern however resolves upon sitting with instructions for pursed lip breathing (SpO2 on 2L supplemental O2 in mid 80s during heavy breathing episodes to 94% at rest.) Reviewed sternal precautions which he needs frequent cues to maintain. Patient will continue to benefit from skilled physical therapy services to further improve independence with functional mobility.   Follow Up Recommendations  CIR (CIR thinks health insurer will not cover. Recommends SNF)     Equipment Recommendations  None recommended by PT    Recommendations for Other Services OT consult     Precautions / Restrictions Precautions Precautions: Sternal;Fall Precaution Comments: left foot drop with fatigue Restrictions Weight Bearing Restrictions: Yes Other Position/Activity Restrictions: Sternal    Mobility  Bed Mobility Overal bed mobility: Needs Assistance Bed Mobility: Rolling;Sidelying to Sit Rolling: Min assist;+2 for physical assistance Sidelying to sit: Mod assist;+2 for physical assistance       General bed mobility comments: Educated patient on log roll technique with cues for sternal precautions and min assist +2 to roll onto Lt side, mod assist +2 for truncal and LE support into seated position.  Transfers Overall transfer level: Needs assistance Equipment used: 2 person hand held  assist Transfers: Sit to/from UGI CorporationStand;Stand Pivot Transfers Sit to Stand: Mod assist;+2 physical assistance Stand pivot transfers: Mod assist;+2 physical assistance       General transfer comment: Mod assist +2 for boost to stand from lowest bed setting and reclining chair. Cues for technique and to safely maintain sternal precautions. Pt was leaning posteriorly when performed from recliner.  Mod assist +2 for balance with pivot transfer to chair, able to take several small steps towards left with very poor coordination and max VC for sequencing.  Ambulation/Gait Ambulation/Gait assistance: Mod assist;+2 physical assistance Ambulation Distance (Feet): 3 Feet Assistive device: 2 person hand held assist Gait Pattern/deviations: Step-through pattern;Ataxic;Scissoring;Decreased stride length;Narrow base of support Gait velocity: very slow   General Gait Details: Took several small steps with Mod assist +2 for balance. max cues for sequencing, pt with scissoring gait pattern. Needed to rest due to SOB.   Stairs            Wheelchair Mobility    Modified Rankin (Stroke Patients Only)       Balance                                    Cognition Arousal/Alertness: Awake/alert Behavior During Therapy: WFL for tasks assessed/performed Overall Cognitive Status: Impaired/Different from baseline Area of Impairment: Following commands;Safety/judgement;Problem solving     Memory: Decreased recall of precautions;Decreased short-term memory Following Commands: Follows one step commands consistently Safety/Judgement: Decreased awareness of safety;Decreased awareness of deficits   Problem Solving: Slow processing;Difficulty sequencing;Requires tactile cues;Requires verbal cues      Exercises General Exercises - Lower Extremity Ankle Circles/Pumps: AROM;Both;10 reps;Seated Quad Sets:  Strengthening;Both;10 reps;Seated Long Arc Quad: Strengthening;Both;10  reps;Seated;AAROM Hip Flexion/Marching: AAROM;Strengthening;Both;10 reps;Seated    General Comments General comments (skin integrity, edema, etc.): Ambulating SpO2 into mid 80s on 2L supplemental O2. HR in 90s. SpO2 94% on 2L at rest, BP 99/75.      Pertinent Vitals/Pain Pain Assessment: No/denies pain Faces Pain Scale: Hurts little more Pain Intervention(s): Monitored during session;Repositioned    Home Living                      Prior Function            PT Goals (current goals can now be found in the care plan section) Acute Rehab PT Goals Patient Stated Goal: None stated PT Goal Formulation: With patient Time For Goal Achievement: 09/15/14 Potential to Achieve Goals: Good Progress towards PT goals: Progressing toward goals    Frequency  Min 3X/week    PT Plan Current plan remains appropriate    Co-evaluation             End of Session Equipment Utilized During Treatment: Gait belt;Oxygen Activity Tolerance: Patient limited by fatigue Patient left: in chair;with call bell/phone within reach;with chair alarm set;with nursing/sitter in room     Time: 1032-1056 PT Time Calculation (min) (ACUTE ONLY): 24 min  Charges:  $Therapeutic Exercise: 8-22 mins $Therapeutic Activity: 8-22 mins                    G Codes:      Berton Mount 09-27-2014, 11:21 AM Charlsie Merles, PT [phone]

## 2014-09-02 NOTE — Progress Notes (Signed)
4 Days Post-Op Procedure(s) (LRB): CORONARY ARTERY BYPASS GRAFTING (CABG), ON PUMP, TIMES FOUR, USING LEFT INTERNAL MAMMARY ARTERY, RIGHT GREATER SAPHENOUS VEIN HARVESTED ENDOSCOPICALLY (N/A) MITRAL VALVE REPAIR (MVR) (N/A) TRANSESOPHAGEAL ECHOCARDIOGRAM (TEE) (N/A) Subjective: Some confusion over the weekend Seems better this AM Denies pain and nausea  Objective: Vital signs in last 24 hours: Temp:  [97.4 F (36.3 [name])-98.3 F (36.8 [name])] 97.8 F (36.6 [name]) (03/07 0400) Pulse Rate:  [89-92] 91 (03/07 0700) Cardiac Rhythm:  [-] Atrial paced (03/07 0700) Resp:  [13-23] 23 (03/07 0700) BP: (90-119)/(44-91) 103/76 mmHg (03/07 0700) SpO2:  [95 %-100 %] 97 % (03/07 0700) Weight:  [148 lb 13 oz (67.5 kg)] 148 lb 13 oz (67.5 kg) (03/07 0500)  Hemodynamic parameters for last 24 hours:    Intake/Output from previous day: 03/06 0701 - 03/07 0700 In: 1407.1 [P.O.:1020; I.V.:387.1] Out: 1345 [Urine:1345] Intake/Output this shift:    General appearance: alert, cooperative and no distress Neurologic: nonfocal, oriented to person Heart: regular rate and rhythm Lungs: diminished breath sounds bibasilar  Lab Results:  Recent Labs  09/01/14 0600 09/02/14 0309  WBC 8.5 6.4  HGB 11.2* 10.6*  HCT 32.2* 30.2*  PLT 52* 59*   BMET:  Recent Labs  09/01/14 0600 09/02/14 0309  NA 133* 132*  K 4.1 5.1  CL 104 105  CO2 20 18*  GLUCOSE 103* 100*  BUN 33* 38*  CREATININE 1.73* 1.47*  CALCIUM 8.5 8.1*    PT/INR: No results for input(s): LABPROT, INR in the last 72 hours. ABG    Component Value Date/Time   PHART 7.449 08/30/2014 0139   HCO3 20.5 08/30/2014 0139   TCO2 17 08/30/2014 1709   ACIDBASEDEF 3.0* 08/30/2014 0139   O2SAT 69.0 08/31/2014 0417   CBG (last 3)   Recent Labs  09/01/14 1608 09/02/14 0010 09/02/14 0420  GLUCAP 142* 108* 100*    Assessment/Plan: S/P Procedure(s) (LRB): CORONARY ARTERY BYPASS GRAFTING (CABG), ON PUMP, TIMES FOUR, USING LEFT INTERNAL  MAMMARY ARTERY, RIGHT GREATER SAPHENOUS VEIN HARVESTED ENDOSCOPICALLY (N/A) MITRAL VALVE REPAIR (MVR) (N/A) TRANSESOPHAGEAL ECHOCARDIOGRAM (TEE) (N/A) -  NEURO- confusion, did sleep a little last night, no focal deficit  CV- BP OK on dopamine- will try to wean it today  RESP_ bibasilar atelectasis, bilateral effusions  RENAL- creatinine better this AM  Still total body volume overloaded  ENDO- CBG better  Thrombocytopenia- PLT slightly better this AM, use SCD for DVT prophylaxis, no lovenox  Deconditioning- severe- PT   LOS: 13 days    Darren [name] 09/02/2014

## 2014-09-02 NOTE — Progress Notes (Signed)
      [address]       [place] [id number]             [phone]      No complaints  BP 106/68 mmHg  Pulse 89  Temp(Src) 96.9 F (36.1 C) (Axillary)  Resp 18  Ht 5\' 7"  (1.702 m)  Wt 148 lb 13 oz (67.5 kg)  BMI 23.30 kg/m2  SpO2 96%   Intake/Output Summary (Last 24 hours) at 09/02/14 1804 Last data filed at 09/02/14 1700  Gross per 24 hour  Intake  716.8 ml  Output   1835 ml  Net -1118.2 ml    Still on 5 mcg/kg/min of dopamine  Slow progress  Viviann SpareSteven C. Dorris FetchHendrickson, MD [facility] [phone]

## 2014-09-03 LAB — GLUCOSE, CAPILLARY
GLUCOSE-CAPILLARY: 74 mg/dL (ref 70–99)
Glucose-Capillary: 77 mg/dL (ref 70–99)
Glucose-Capillary: 141 mg/dL — ABNORMAL HIGH (ref 70–99)
Glucose-Capillary: 139 mg/dL — ABNORMAL HIGH (ref 70–99)
Glucose-Capillary: 109 mg/dL — ABNORMAL HIGH (ref 70–99)

## 2014-09-03 MED ORDER — INSULIN ASPART 100 UNIT/ML ~~LOC~~ SOLN
0.0000 [IU] | Freq: Three times a day (TID) | SUBCUTANEOUS | Status: DC
  Administered 2014-09-03 (×2): 2 [IU] via SUBCUTANEOUS
  Administered 2014-09-07: 1 [IU] via SUBCUTANEOUS
  Administered 2014-09-08: 2 [IU] via SUBCUTANEOUS
  Administered 2014-09-09 – 2014-09-11 (×2): 1 [IU] via SUBCUTANEOUS

## 2014-09-03 MED ORDER — VITAMIN B-1 100 MG PO TABS
100.0000 mg | ORAL_TABLET | Freq: Every day | ORAL | Status: DC
  Administered 2014-09-04 – 2014-09-12 (×9): 100 mg via ORAL
  Filled 2014-09-03 (×9): qty 1

## 2014-09-03 NOTE — Clinical Social Work Note (Addendum)
CSW contacted patient's daughter to discuss bed offers, daughter said she will be in the hospital today and will call CSW once she arrives to discuss bed offers that were given.  4:45pm CSW met with patient's daughter Suanne Marker to discuss bed offers.  CSW explained process of finding placement for patient and list of bed offers was given to daughter to discuss with his other daughter.  CSW will continue to follow progress of patient and discharge planning, assessment to follow.  Jones Broom. [place], MSW, [place] 09/03/2014 5:18 PM

## 2014-09-03 NOTE — Progress Notes (Signed)
Patient ID: Darren [name], male   DOB: 06/22/1941, 74 y.o.   MRN: [id number]  SICU Evening Rounds  BP borderline on dopamine 1 mcg  Diuresing fairly well  Working with PT for severe debilitation.

## 2014-09-03 NOTE — Progress Notes (Signed)
5 Days Post-Op Procedure(s) (LRB): CORONARY ARTERY BYPASS GRAFTING (CABG), ON PUMP, TIMES FOUR, USING LEFT INTERNAL MAMMARY ARTERY, RIGHT GREATER SAPHENOUS VEIN HARVESTED ENDOSCOPICALLY (N/A) MITRAL VALVE REPAIR (MVR) (N/A) TRANSESOPHAGEAL ECHOCARDIOGRAM (TEE) (N/A) Subjective: "I'm still swollen", denies pain  Objective: Vital signs in last 24 hours: Temp:  [96.9 F (36.1 C)-98 F (36.7 C)] 97.8 F (36.6 C) (03/08 0728) Pulse Rate:  [71-92] 90 (03/08 0700) Cardiac Rhythm:  [-] Atrial paced (03/08 0600) Resp:  [11-22] 12 (03/08 0700) BP: (86-120)/(55-87) 105/77 mmHg (03/08 0700) SpO2:  [88 %-100 %] 98 % (03/08 0711)  Hemodynamic parameters for last 24 hours:    Intake/Output from previous day: 03/07 0701 - 03/08 0700 In: 677.4 [P.O.:350; I.V.:327.4] Out: 1930 [Urine:1930] Intake/Output this shift:    General appearance: alert, cooperative and no distress Neurologic: left sided weakness- chronic Heart: regular rate and rhythm Lungs: diminished breath sounds bibasilar Abdomen: normal findings: soft, non-tender Extremities: edema 1+ Wound: clean and dry  Lab Results:  Recent Labs  09/01/14 0600 09/02/14 0309  WBC 8.5 6.4  HGB 11.2* 10.6*  HCT 32.2* 30.2*  PLT 52* 59*   BMET:  Recent Labs  09/01/14 0600 09/02/14 0309  NA 133* 132*  K 4.1 5.1  CL 104 105  CO2 20 18*  GLUCOSE 103* 100*  BUN 33* 38*  CREATININE 1.73* 1.47*  CALCIUM 8.5 8.1*    PT/INR: No results for input(s): LABPROT, INR in the last 72 hours. ABG    Component Value Date/Time   PHART 7.449 08/30/2014 0139   HCO3 20.5 08/30/2014 0139   TCO2 17 08/30/2014 1709   ACIDBASEDEF 3.0* 08/30/2014 0139   O2SAT 69.0 08/31/2014 0417   CBG (last 3)   Recent Labs  09/02/14 1932 09/02/14 2336 09/03/14 0347  GLUCAP 88 94 77    Assessment/Plan: S/P Procedure(s) (LRB): CORONARY ARTERY BYPASS GRAFTING (CABG), ON PUMP, TIMES FOUR, USING LEFT INTERNAL MAMMARY ARTERY, RIGHT GREATER SAPHENOUS  VEIN HARVESTED ENDOSCOPICALLY (N/A) MITRAL VALVE REPAIR (MVR) (N/A) TRANSESOPHAGEAL ECHOCARDIOGRAM (TEE) (N/A) -  CV- in SR with PVCs, continue a pacing for now  Will avoid anticoagulation due to fall risk unless he has a fib  Continue to wean dopamine as tolerated  RESP- COPD, continue IS, diuresis, bronchodilators  RENAL- diuresing with BID lasix, keep Foley for now  ENDO- CBG well controlled  Deconditioning- severe, prior CVA, continue PT     LOS: 14 days    Loreli SlotSteven C Kyson Kupper 09/03/2014

## 2014-09-03 NOTE — Clinical Social Work Note (Signed)
CSW received consult for patient to discharge to SNF once he is medically ready and discharge orders have been received.  [place].  Ervin KnackEric R. Avriel Kandel, MSW, LCSWA [phone] 09/03/2014 11:56 AM

## 2014-09-03 NOTE — Progress Notes (Signed)
Physical Therapy Treatment Patient Details Name: Darren HarrisonRichard M Harrel MRN: [id number] DOB: 06/23/1941 Today's Date: 09/03/2014    History of Present Illness 74 y.o. male smoker with HTN, HLD, GERD, CVA, CKD (baseline Cr 1.6), with L3 compression fracture s/p kyphoplasty on 2/10and history of MR who presents with new onset CHF and NSTEMI. 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 Practiced bed mobility and transfer training with min - mod assist +2. Limited by new onset of Rt foot pain starting at the mid foot and radiating distally into all toes. Very tender to palpation. Edematous however appears equal in size to Lt foot. Cold to touch. Some pain with active movement, but no pain in malleoli or calcaneus. Negative Homans and denies pain upon palpation of posterior compartment of leg. Patient will continue to benefit from skilled physical therapy services to further improve independence with functional mobility.   Follow Up Recommendations  CIR (CIR thinks health insurer will not cover. Recommends SNF)     Equipment Recommendations  None recommended by PT    Recommendations for Other Services OT consult     Precautions / Restrictions Precautions Precautions: Sternal;Fall Precaution Comments: left foot drop with fatigue Restrictions Weight Bearing Restrictions: Yes Other Position/Activity Restrictions: Sternal    Mobility  Bed Mobility Overal bed mobility: Needs Assistance Bed Mobility: Rolling;Sidelying to Sit Rolling: Min assist;+2 for physical assistance Sidelying to sit: Mod assist;+2 for physical assistance       General bed mobility comments: Reviewed log roll technique with instructions for technique and to maintain sternal precautisons. Min assist to roll. Mod assist +2 for trunk support and to bring LEs off of bed.  Transfers Overall transfer level: Needs assistance Equipment used: 2 person hand held assist Transfers: Sit  to/from [employer]GI CorporationStand;Stand Pivot Transfers Sit to Stand: Mod assist Stand pivot transfers: Mod assist;+2 physical assistance       General transfer comment: Mod assist for boost to rise from lowest bed setting. First attempt unsuccessful due to Rt foot pain. Second attempt improved with cues to bear weight through Rt heel which is not painful. Mod assist +2 for pivot to [facility] with assist for stability and max cues for sequencing. Difficulty lifting Lt foot today due to poor weight bearing through Rt foot.  Ambulation/Gait                 Stairs            Wheelchair Mobility    Modified Rankin (Stroke Patients Only)       Balance                                    Cognition Arousal/Alertness: Awake/alert Behavior During Therapy: WFL for tasks assessed/performed Overall Cognitive Status: Impaired/Different from baseline Area of Impairment: Following commands;Safety/judgement;Problem solving     Memory: Decreased recall of precautions;Decreased short-term memory Following Commands: Follows one step commands consistently Safety/Judgement: Decreased awareness of safety;Decreased awareness of deficits   Problem Solving: Slow processing;Difficulty sequencing;Requires tactile cues;Requires verbal cues      Exercises General Exercises - Lower Extremity Ankle Circles/Pumps: AROM;10 reps;Right;Supine    General Comments General comments (skin integrity, edema, etc.): Rt foot pain starting at the mid foot and descending distally. Very tender to palpation. Edematous however appears equal to Lt foot.  Some pain with active movement, no pain in malleoli or calcaneus.  Pertinent Vitals/Pain Faces Pain Scale: Hurts whole lot Pain Location: Rt foot with palpation, and weight bearing Pain Intervention(s): Monitored during session;Repositioned    Home Living                      Prior Function            PT Goals (current goals can now be found in  the care plan section) Acute Rehab PT Goals Patient Stated Goal: None stated PT Goal Formulation: With patient Time For Goal Achievement: 09/15/14 Potential to Achieve Goals: Good Progress towards PT goals: Progressing toward goals    Frequency  Min 3X/week    PT Plan Current plan remains appropriate    Co-evaluation             End of Session Equipment Utilized During Treatment: Gait belt;Oxygen Activity Tolerance: Patient limited by pain;Patient limited by fatigue Patient left: with call bell/phone within reach;Other (comment) [facility] - RN aware)     Time: 4098-1191 PT Time Calculation (min) (ACUTE ONLY): 20 min  Charges:  $Therapeutic Activity: 8-22 mins                    G Codes:      Berton Mount 09-19-14, 11:37 AM Charlsie Merles, PT [phone]

## 2014-09-03 NOTE — Clinical Social Work Placement (Signed)
Clinical Social Work Department CLINICAL SOCIAL WORK PLACEMENT NOTE 09/03/2014  Patient:  [name],Darren M  Account Number:  [id number] Admit date:  07/31/2014  Clinical Social Worker:  Beautiful Pensyl, LCSWA  Date/time:  09/03/2014 05:37 PM  Clinical Social Work is seeking post-discharge placement for this patient at the following level of care:   SKILLED NURSING   (*CSW will update this form in Epic as items are completed)   09/03/2014  Patient/family provided with [facility] of Clinical Social Work's list of facilities offering this level of care within the geographic area requested by the patient (or if unable, by the patient's family).  09/03/2014  Patient/family informed of their freedom to choose among providers that offer the needed level of care, that participate in Medicare, Medicaid or managed care program needed by the patient, have an available bed and are willing to accept the patient.  09/03/2014  Patient/family informed of [facility]' ownership interest in [facility], as well as of the fact that they are under no obligation to receive care at this facility.  PASARR submitted to EDS on 09/03/2014 PASARR number received on 09/03/2014  FL2 transmitted to all facilities in geographic area requested by pt/family on  09/03/2014 FL2 transmitted to all facilities within larger geographic area on 09/03/2014  Patient informed that his/her managed care company has contracts with or will negotiate with  certain facilities, including the following:     Patient/family informed of bed offers received:  09/03/2014 Patient chooses bed at  Physician recommends and patient chooses bed at    Patient to be transferred to  on   Patient to be transferred to facility by  Patient and family notified of transfer on  Name of family member notified:    The following physician request were entered in Epic: Physician Request  Please sign FL2.    Additional  Comments:  Ervin Knackric R. Athleen Feltner, MSW, Theresia MajorsLCSWA [phone] 09/03/2014 5:38 PM

## 2014-09-03 NOTE — Clinical Social Work Psychosocial (Signed)
Clinical Social Work Department BRIEF PSYCHOSOCIAL ASSESSMENT 09/03/2014  Patient:  Darren HarrisonSEAGRAVES,Saban M     Account Number:  [id number]     Admit date:  2014-10-12  Clinical Social Worker:  Elouise MunroeANTERHAUS,Kishaun Erekson, LCSWA  Date/Time:  09/03/2014 05:27 PM  Referred by:  Physician  Date Referred:  09/03/2014 Referred for  SNF Placement   Other Referral:   Interview type:  Family Other interview type:   Patient did not want to participate in discussion about SNFs due to his lethargy.    PSYCHOSOCIAL DATA Living Status:  FAMILY Admitted from facility:   Level of care:   Primary support name:  Astrid DraftsRhonda Miller Primary support relationship to patient:  FAMILY Degree of support available:   Patient lives with his daughter, but both daughters are not able to stay with patient 24 hours.    CURRENT CONCERNS Current Concerns  Post-Acute Placement   Other Concerns:    SOCIAL WORK ASSESSMENT / PLAN Patient is a 74 year old male who lives with his daughters. Patient did not want to speak with CSW, but daughter was at bedside.  Patient's daughter states that he is usually alert and oriented, and able to do most things on his own. Patient's daughter reports that they can not take care of him 24 hours a day, and that he is very weak.  Patient's daughter are in agreement that patient needs to go to short term rehab, to improve his strength and balance.  Patient's daughter stated that she and her sister will look at Bed offers and determine where they would like their father to go once he is medically ready.  Patient and his daughter are in agreement to going to short term rehab SNF.  Patient will discharge to SNF once he is medically ready and discharge orders have been received.   Assessment/plan status:  Psychosocial Support/Ongoing Assessment of Needs Other assessment/ plan:   Information/referral to community resources:    PATIENT'S/FAMILY'S RESPONSE TO PLAN OF CARE: Patient and family in agreement to  going to SNF for short term rehab.    Ervin KnackEric R. Jayshaun Phillips, MSW, Theresia MajorsLCSWA [phone] 09/03/2014 5:36 PM

## 2014-09-04 ENCOUNTER — Inpatient Hospital Stay (HOSPITAL_COMMUNITY): Payer: Medicare Other

## 2014-09-04 LAB — BASIC METABOLIC PANEL
ANION GAP: 3 — AB (ref 5–15)
BUN: 33 mg/dL — ABNORMAL HIGH (ref 6–23)
CALCIUM: 7.5 mg/dL — AB (ref 8.4–10.5)
CHLORIDE: 106 mmol/L (ref 96–112)
CO2: 23 mmol/L (ref 19–32)
Creatinine, Ser: 1.37 mg/dL — ABNORMAL HIGH (ref 0.50–1.35)
GFR calc Af Amer: 57 mL/min — ABNORMAL LOW (ref >90)
GFR, EST NON AFRICAN AMERICAN: 50 mL/min — AB (ref >90)
Glucose, Bld: 74 mg/dL (ref 70–99)
POTASSIUM: 3.2 mmol/L — AB (ref 3.5–5.1)
Sodium: 132 mmol/L — ABNORMAL LOW (ref 135–145)

## 2014-09-04 LAB — CBC
HCT: 29.2 % — ABNORMAL LOW (ref 39.0–52.0)
Hemoglobin: 10.1 g/dL — ABNORMAL LOW (ref 13.0–17.0)
MCH: 30.1 pg (ref 26.0–34.0)
MCHC: 34.6 g/dL (ref 30.0–36.0)
MCV: 86.9 fL (ref 78.0–100.0)
Platelets: 81 10*3/uL — ABNORMAL LOW (ref 150–400)
RBC: 3.36 MIL/uL — AB (ref 4.22–5.81)
RDW: 15.3 % (ref 11.5–15.5)
WBC: 4.3 10*3/uL (ref 4.0–10.5)

## 2014-09-04 LAB — GLUCOSE, CAPILLARY
GLUCOSE-CAPILLARY: 81 mg/dL (ref 70–99)
Glucose-Capillary: 82 mg/dL (ref 70–99)
Glucose-Capillary: 120 mg/dL — ABNORMAL HIGH (ref 70–99)
Glucose-Capillary: 75 mg/dL (ref 70–99)

## 2014-09-04 MED ORDER — POTASSIUM CHLORIDE 10 MEQ/50ML IV SOLN: 10.0000 meq | INTRAVENOUS | Status: DC

## 2014-09-04 MED ORDER — POTASSIUM CHLORIDE 10 MEQ/100ML IV SOLN
10.0000 meq | INTRAVENOUS | Status: AC
  Administered 2014-09-04 (×3): 10 meq via INTRAVENOUS
  Filled 2014-09-04 (×3): qty 100

## 2014-09-04 MED ORDER — POTASSIUM CHLORIDE 10 MEQ/50ML IV SOLN
10.0000 meq | INTRAVENOUS | Status: DC
  Administered 2014-09-04 (×2): 10 meq via INTRAVENOUS
  Filled 2014-09-04 (×2): qty 50

## 2014-09-04 MED ORDER — COLCHICINE 0.6 MG PO TABS
0.6000 mg | ORAL_TABLET | Freq: Every day | ORAL | Status: DC
  Administered 2014-09-04 – 2014-09-05 (×2): 0.6 mg via ORAL
  Filled 2014-09-04 (×3): qty 1

## 2014-09-04 MED ORDER — FUROSEMIDE 40 MG PO TABS
40.0000 mg | ORAL_TABLET | Freq: Every day | ORAL | Status: DC
  Administered 2014-09-04 – 2014-09-05 (×2): 40 mg via ORAL
  Filled 2014-09-04 (×3): qty 1

## 2014-09-04 MED ORDER — ENSURE COMPLETE PO LIQD
237.0000 mL | Freq: Two times a day (BID) | ORAL | Status: DC
  Administered 2014-09-04 – 2014-09-12 (×9): 237 mL via ORAL

## 2014-09-04 NOTE — Progress Notes (Signed)
6 Days Post-Op Procedure(s) (LRB): CORONARY ARTERY BYPASS GRAFTING (CABG), ON PUMP, TIMES FOUR, USING LEFT INTERNAL MAMMARY ARTERY, RIGHT GREATER SAPHENOUS VEIN HARVESTED ENDOSCOPICALLY (N/A) MITRAL VALVE REPAIR (MVR) (N/A) TRANSESOPHAGEAL ECHOCARDIOGRAM (TEE) (N/A) Subjective: "Not good" C/[facility] and in right foot  Objective: Vital signs in last 24 hours: Temp:  [97.3 F (36.3 C)-98 F (36.7 C)] 97.4 F (36.3 C) (03/09 0405) Pulse Rate:  [64-81] 80 (03/09 0700) Cardiac Rhythm:  [-] Atrial paced (03/09 0400) Resp:  [11-23] 14 (03/09 0700) BP: (88-125)/(59-105) 116/82 mmHg (03/09 0700) SpO2:  [93 %-100 %] 100 % (03/09 0700) Weight:  [143 lb 8.3 oz (65.1 kg)] 143 lb 8.3 oz (65.1 kg) (03/09 0500)  Hemodynamic parameters for last 24 hours:    Intake/Output from previous day: 03/08 0701 - 03/09 0700 In: 756.6 [P.O.:480; I.V.:276.6] Out: 1135 [Urine:1135] Intake/Output this shift:    General appearance: alert and no distress Neurologic: at baseline Heart: regular rate and rhythm Lungs: diminished breath sounds bibasilar Abdomen: normal findings: soft, non-tender Extremities: edema 2+ and tender to palpation right arch, no palpable abnormality, foot well perfused Wound: clean and dry  Lab Results:  Recent Labs  09/02/14 0309 09/04/14 0500  WBC 6.4 4.3  HGB 10.6* 10.1*  HCT 30.2* 29.2*  PLT 59* 81*   BMET:  Recent Labs  09/02/14 0309 09/04/14 0500  NA 132* 132*  K 5.1 3.2*  CL 105 106  CO2 18* 23  GLUCOSE 100* 74  BUN 38* 33*  CREATININE 1.47* 1.37*  CALCIUM 8.1* 7.5*    PT/INR: No results for input(s): LABPROT, INR in the last 72 hours. ABG    Component Value Date/Time   PHART 7.449 08/30/2014 0139   HCO3 20.5 08/30/2014 0139   TCO2 17 08/30/2014 1709   ACIDBASEDEF 3.0* 08/30/2014 0139   O2SAT 69.0 08/31/2014 0417   CBG (last 3)   Recent Labs  09/03/14 1126 09/03/14 1544 09/03/14 2133  GLUCAP 141* 139* 109*     Assessment/Plan: S/P Procedure(s) (LRB): CORONARY ARTERY BYPASS GRAFTING (CABG), ON PUMP, TIMES FOUR, USING LEFT INTERNAL MAMMARY ARTERY, RIGHT GREATER SAPHENOUS VEIN HARVESTED ENDOSCOPICALLY (N/A) MITRAL VALVE REPAIR (MVR) (N/A) TRANSESOPHAGEAL ECHOCARDIOGRAM (TEE) (N/A) -  CV- s/p CABG MVR  BP still borderline- dc dopamine  Will turn off pacer and follow rhythm  Avoid coumadin if possible due to fall risk  RESP- bilateral effusions, atelectasis- IS  Continue bronchodilators  RENAL- creatinine improving. Diuresing as BP allows- continue  Hypokalemia- supplement IV  ENDO- CBG well controlled  Right foot pain- does not appear to be ischemic- No obvious injury, ? Gout- will try empiric colchicine  Thrombocytopenia- improving, not on heparin  Deconditioning- continue PT   LOS: 15 days    Loreli SlotSteven C Taleisha Kaczynski 09/04/2014

## 2014-09-04 NOTE — Progress Notes (Signed)
Patient ID: Georgeanna Harrisonichard M Quam, male   DOB: 03/09/1941, 74 y.o.   MRN: [id number] EVENING ROUNDS NOTE :     [address]       [place] [id number]             [phone]                 6 Days Post-Op Procedure(s) (LRB): CORONARY ARTERY BYPASS GRAFTING (CABG), ON PUMP, TIMES FOUR, USING LEFT INTERNAL MAMMARY ARTERY, RIGHT GREATER SAPHENOUS VEIN HARVESTED ENDOSCOPICALLY (N/A) MITRAL VALVE REPAIR (MVR) (N/A) TRANSESOPHAGEAL ECHOCARDIOGRAM (TEE) (N/A)  Total Length of Stay:  LOS: 15 days  BP 114/65 mmHg  Pulse 79  Temp(Src) 97.4 F (36.3 C) (Oral)  Resp 14  Ht 5\' 7"  (1.702 m)  Wt 143 lb 8.3 oz (65.1 kg)  BMI 22.47 kg/m2  SpO2 94%  .Intake/Output      03/08 0701 - 03/09 0700 03/09 0701 - 03/10 0700   P.O. 480 0   I.V. (mL/kg) 276.6 (4.2) 80 (1.2)   IV Piggyback  300   Total Intake(mL/kg) 756.6 (11.6) 380 (5.8)   Urine (mL/kg/hr) 1135 (0.7) 430 (0.6)   Total Output 1135 430   Net -378.4 -50          . sodium chloride Stopped (08/31/14 0901)  . sodium chloride Stopped (08/30/14 2200)  . sodium chloride 10 mL/hr at 09/04/14 1500  . phenylephrine (NEO-SYNEPHRINE) Adult infusion 5 mcg/min (08/30/14 1000)     Lab Results  Component Value Date   WBC 4.3 09/04/2014   HGB 10.1* 09/04/2014   HCT 29.2* 09/04/2014   PLT 81* 09/04/2014   GLUCOSE 74 09/04/2014   ALT 39 08/17/2014   AST 32 08/07/2014   NA 132* 09/04/2014   K 3.2* 09/04/2014   CL 106 09/04/2014   CREATININE 1.37* 09/04/2014   BUN 33* 09/04/2014   CO2 23 09/04/2014   TSH 3.373 08/20/2014   INR 1.50* 08/31/2014   HGBA1C 5.6 08/28/2014   Chronic Kidney Disease   Stage I     GFR >90  Stage II    GFR 60-89  Stage IIIA GFR 45-59  Stage IIIB GFR 30-44  Stage IV   GFR 15-29  Stage V    GFR  <15  Lab Results  Component Value Date   CREATININE 1.37* 09/04/2014   Estimated Creatinine Clearance: 44.2 mL/min (by C-G formula based on Cr of 1.37).  plt 81,00, toes both feet with purpurea and  swollen Avoiding heparin   Delight OvensEdward B Dryden Tapley MD  Beeper [phone] Office [phone] 09/04/2014 5:12 PM

## 2014-09-05 ENCOUNTER — Inpatient Hospital Stay (HOSPITAL_COMMUNITY): Payer: Medicare Other

## 2014-09-05 LAB — BASIC METABOLIC PANEL
Anion gap: 9 (ref 5–15)
BUN: 34 mg/dL — ABNORMAL HIGH (ref 6–23)
CO2: 24 mmol/L (ref 19–32)
Calcium: 8.4 mg/dL (ref 8.4–10.5)
Chloride: 103 mmol/L (ref 96–112)
Creatinine, Ser: 1.4 mg/dL — ABNORMAL HIGH (ref 0.50–1.35)
GFR calc non Af Amer: 48 mL/min — ABNORMAL LOW (ref >90)
GFR, EST AFRICAN AMERICAN: 56 mL/min — AB (ref >90)
Glucose, Bld: 102 mg/dL — ABNORMAL HIGH (ref 70–99)
POTASSIUM: 4.4 mmol/L (ref 3.5–5.1)
Sodium: 136 mmol/L (ref 135–145)

## 2014-09-05 LAB — CBC
HEMATOCRIT: 35.1 % — AB (ref 39.0–52.0)
HEMOGLOBIN: 11.9 g/dL — AB (ref 13.0–17.0)
MCH: 30.3 pg (ref 26.0–34.0)
MCHC: 33.9 g/dL (ref 30.0–36.0)
MCV: 89.3 fL (ref 78.0–100.0)
Platelets: 116 10*3/uL — ABNORMAL LOW (ref 150–400)
RBC: 3.93 MIL/uL — AB (ref 4.22–5.81)
RDW: 15.3 % (ref 11.5–15.5)
WBC: 7 10*3/uL (ref 4.0–10.5)

## 2014-09-05 LAB — GLUCOSE, CAPILLARY
GLUCOSE-CAPILLARY: 66 mg/dL — AB (ref 70–99)
GLUCOSE-CAPILLARY: 120 mg/dL — AB (ref 70–99)
GLUCOSE-CAPILLARY: 116 mg/dL — AB (ref 70–99)
Glucose-Capillary: 78 mg/dL (ref 70–99)
Glucose-Capillary: 104 mg/dL — ABNORMAL HIGH (ref 70–99)
Glucose-Capillary: 101 mg/dL — ABNORMAL HIGH (ref 70–99)

## 2014-09-05 MED ORDER — SODIUM CHLORIDE 0.9 % IJ SOLN
10.0000 mL | Freq: Two times a day (BID) | INTRAMUSCULAR | Status: DC
  Administered 2014-09-05: 40 mL
  Administered 2014-09-05: 20 mL
  Administered 2014-09-06 – 2014-09-12 (×10): 10 mL

## 2014-09-05 MED ORDER — SODIUM CHLORIDE 0.9 % IJ SOLN: 10.0000 mL | INTRAMUSCULAR | Status: DC | PRN

## 2014-09-05 NOTE — Progress Notes (Signed)
7 Days Post-Op Procedure(s) (LRB): CORONARY ARTERY BYPASS GRAFTING (CABG), ON PUMP, TIMES FOUR, USING LEFT INTERNAL MAMMARY ARTERY, RIGHT GREATER SAPHENOUS VEIN HARVESTED ENDOSCOPICALLY (N/A) MITRAL VALVE REPAIR (MVR) (N/A) TRANSESOPHAGEAL ECHOCARDIOGRAM (TEE) (N/A) Subjective: Feels better today Appetite poor Per RN almost fell when getting up to chair this AM  Objective: Vital signs in last 24 hours: Temp:  [96.3 F (35.7 C)-98.4 F (36.9 C)] 97.9 F (36.6 C) (03/10 0351) Pulse Rate:  [57-86] 70 (03/10 0700) Cardiac Rhythm:  [-] Normal sinus rhythm (03/10 0700) Resp:  [13-31] 13 (03/10 0700) BP: (91-125)/(64-89) 113/67 mmHg (03/10 0700) SpO2:  [86 %-100 %] 96 % (03/10 0700) Weight:  [143 lb 1.3 oz (64.9 kg)] 143 lb 1.3 oz (64.9 kg) (03/10 0700)  Hemodynamic parameters for last 24 hours:    Intake/Output from previous day: 03/09 0701 - 03/10 0700 In: 390 [I.V.:90; IV Piggyback:300] Out: 985 [Urine:985] Intake/Output this shift:    General appearance: alert, cooperative and no distress Neurologic: no change Heart: regular rate and rhythm Lungs: diminished breath sounds bibasilar Extremities: edema 2+, feet cool and mottled, pulses intact, still tender on plantar aspect right foot Wound: clean and dry  Lab Results:  Recent Labs  09/04/14 0500 09/05/14 0512  WBC 4.3 7.0  HGB 10.1* 11.9*  HCT 29.2* 35.1*  PLT 81* 116*   BMET:  Recent Labs  09/04/14 0500 09/05/14 0512  NA 132* 136  K 3.2* 4.4  CL 106 103  CO2 23 24  GLUCOSE 74 102*  BUN 33* 34*  CREATININE 1.37* 1.40*  CALCIUM 7.5* 8.4    PT/INR: No results for input(s): LABPROT, INR in the last 72 hours. ABG    Component Value Date/Time   PHART 7.449 08/30/2014 0139   HCO3 20.5 08/30/2014 0139   TCO2 17 08/30/2014 1709   ACIDBASEDEF 3.0* 08/30/2014 0139   O2SAT 69.0 08/31/2014 0417   CBG (last 3)   Recent Labs  09/04/14 1129 09/04/14 1633 09/04/14 2140  GLUCAP 81 120* 75     Assessment/Plan: S/P Procedure(s) (LRB): CORONARY ARTERY BYPASS GRAFTING (CABG), ON PUMP, TIMES FOUR, USING LEFT INTERNAL MAMMARY ARTERY, RIGHT GREATER SAPHENOUS VEIN HARVESTED ENDOSCOPICALLY (N/A) MITRAL VALVE REPAIR (MVR) (N/A) TRANSESOPHAGEAL ECHOCARDIOGRAM (TEE) (N/A) -  Progress remains slow  CV- in SR and BP OK off dopamine, but both HR and BP relatively low- will hold metoprolol and observe  RESP- bilateral effusions and atelectasis- IS, bronchodilators, diuresis  RENAL- hypokalemia improved, continue lasix, creatinine stable  ENDO- CBG well controlled  Deconditioning- severe  DVT- prophylaxis- continue SCD, no lovenox due to thrombocytopenia  Thrombocytopenia- imporving  Malnutrition- protein calorie- diet + supplements  Will likely need rehab v SNF at discharge   LOS: 16 days    Loreli SlotSteven C Dorene Bruni 09/05/2014

## 2014-09-05 NOTE — Progress Notes (Signed)
Peripherally Inserted Central Catheter/Midline Placement  The IV Nurse has discussed with the patient and/or persons authorized to consent for the patient, the purpose of this procedure and the potential benefits and risks involved with this procedure.  The benefits include less needle sticks, lab draws from the catheter and patient may be discharged home with the catheter.  Risks include, but not limited to, infection, bleeding, blood clot (thrombus formation), and puncture of an artery; nerve damage and irregular heat beat.  Alternatives to this procedure were also discussed.  PICC/Midline Placement Documentation     Phone consent from daughter  Vevelyn PatDuncan, Piotr Christopher Jean 09/05/2014, 11:05 AM

## 2014-09-05 NOTE — Progress Notes (Signed)
CT surgery p.m. Rounds  Patient resting comfortably in bed-states he did not ambulate today because of generalized weakness and previous stroke Right PICC line placed, chest x-ray shows satisfactory position with mild bilateral pleural effusions Maintaining sinus rhythm and stable blood pressure off pressor worse Platelet count greater than 110,000 Patient needs encouragement to eat and to mobilize

## 2014-09-05 NOTE — Progress Notes (Signed)
Hypoglycemic Event  CBG: 66  Treatment: 15 GM carbohydrate snack  Symptoms: None  Follow-up CBG: Time:0930 CBG Result:78  Possible Reasons for Event: Inadequate meal intake  Comments/MD notified:Sugar rechecked after pt finished breakfast sugar back up to nl level.     Darren Simasobinson, Darren [name] D  Remember to initiate Hypoglycemia Order Set & complete

## 2014-09-06 ENCOUNTER — Inpatient Hospital Stay (HOSPITAL_COMMUNITY): Payer: Medicare Other

## 2014-09-06 DIAGNOSIS — I5041 Acute combined systolic (congestive) and diastolic (congestive) heart failure: Secondary | ICD-10-CM

## 2014-09-06 DIAGNOSIS — R0602 Shortness of breath: Secondary | ICD-10-CM

## 2014-09-06 DIAGNOSIS — I998 Other disorder of circulatory system: Secondary | ICD-10-CM

## 2014-09-06 DIAGNOSIS — I509 Heart failure, unspecified: Secondary | ICD-10-CM

## 2014-09-06 DIAGNOSIS — M79671 Pain in right foot: Secondary | ICD-10-CM

## 2014-09-06 LAB — COMPREHENSIVE METABOLIC PANEL
ALT: 58 U/L — AB (ref 0–53)
AST: 77 U/L — AB (ref 0–37)
Albumin: 2.5 g/dL — ABNORMAL LOW (ref 3.5–5.2)
Alkaline Phosphatase: 72 U/L (ref 39–117)
Anion gap: 10 (ref 5–15)
BUN: 29 mg/dL — ABNORMAL HIGH (ref 6–23)
CALCIUM: 8.1 mg/dL — AB (ref 8.4–10.5)
CO2: 23 mmol/L (ref 19–32)
Chloride: 103 mmol/L (ref 96–112)
Creatinine, Ser: 1.4 mg/dL — ABNORMAL HIGH (ref 0.50–1.35)
GFR calc Af Amer: 56 mL/min — ABNORMAL LOW (ref >90)
GFR calc non Af Amer: 48 mL/min — ABNORMAL LOW (ref >90)
GLUCOSE: 112 mg/dL — AB (ref 70–99)
Potassium: 3.4 mmol/L — ABNORMAL LOW (ref 3.5–5.1)
Sodium: 136 mmol/L (ref 135–145)
Total Bilirubin: 0.9 mg/dL (ref 0.3–1.2)
Total Protein: 5.2 g/dL — ABNORMAL LOW (ref 6.0–8.3)

## 2014-09-06 LAB — GLUCOSE, CAPILLARY
Glucose-Capillary: 89 mg/dL (ref 70–99)
Glucose-Capillary: 116 mg/dL — ABNORMAL HIGH (ref 70–99)
Glucose-Capillary: 109 mg/dL — ABNORMAL HIGH (ref 70–99)
Glucose-Capillary: 98 mg/dL (ref 70–99)

## 2014-09-06 LAB — CARBOXYHEMOGLOBIN
CARBOXYHEMOGLOBIN: 1.6 % — AB (ref 0.5–1.5)
METHEMOGLOBIN: 0.8 % (ref 0.0–1.5)
O2 SAT: 76.6 %
TOTAL HEMOGLOBIN: 11.1 g/dL — AB (ref 13.5–18.0)

## 2014-09-06 LAB — APTT: aPTT: 67 seconds — ABNORMAL HIGH (ref 24–37)

## 2014-09-06 LAB — BRAIN NATRIURETIC PEPTIDE: B NATRIURETIC PEPTIDE 5: 1726.5 pg/mL — AB (ref 0.0–100.0)

## 2014-09-06 MED ORDER — POTASSIUM CHLORIDE 10 MEQ/50ML IV SOLN
10.0000 meq | INTRAVENOUS | Status: AC
  Administered 2014-09-06 (×3): 10 meq via INTRAVENOUS
  Filled 2014-09-06 (×3): qty 50

## 2014-09-06 MED ORDER — FUROSEMIDE 40 MG PO TABS
40.0000 mg | ORAL_TABLET | Freq: Two times a day (BID) | ORAL | Status: DC
  Administered 2014-09-06 – 2014-09-10 (×8): 40 mg via ORAL
  Filled 2014-09-06 (×11): qty 1

## 2014-09-06 MED ORDER — SODIUM CHLORIDE 0.9 % IV SOLN
0.1400 mg/kg/h | INTRAVENOUS | Status: DC
  Administered 2014-09-06 – 2014-09-08 (×2): 0.1 mg/kg/h via INTRAVENOUS
  Administered 2014-09-10 – 2014-09-11 (×2): 0.12 mg/kg/h via INTRAVENOUS
  Filled 2014-09-06 (×6): qty 250

## 2014-09-06 MED ORDER — COLCHICINE 0.6 MG PO TABS
0.6000 mg | ORAL_TABLET | Freq: Two times a day (BID) | ORAL | Status: DC
  Administered 2014-09-06 – 2014-09-12 (×12): 0.6 mg via ORAL
  Filled 2014-09-06 (×14): qty 1

## 2014-09-06 NOTE — Progress Notes (Signed)
*  PRELIMINARY RESULTS* Vascular Ultrasound Lower extremity venous duplex.   No evidence of DVT. Lower extremity arterial duplex. Right prox FA stenosis 50-99%. Left mid FA stenosis 50-99%. Biphasic flow noted throughout arterial system.  ABI completed:   RIGHT    LEFT    PRESSURE WAVEFORM  PRESSURE WAVEFORM  BRACHIAL N/A -IV location  BRACHIAL 123   DP   DP    AT absent  AT absent   PT 89 Bi PT 100 Bi  PER   PER    GREAT TOE  NA GREAT TOE  NA    RIGHT LEFT  ABI 0.72 0.81      Farrel DemarkJill Eunice, RDMS, RVT  09/06/2014, 3:11 PM

## 2014-09-06 NOTE — Progress Notes (Signed)
   ABI right 0.7 left 0.8.  Duplex bilateral patent SFA but some stenosis otherwise inline flow.  Most likely atherembolic event or possibly HIT event  Will continue to follow.  Will consider Agram and possible right leg intervention if continues to deteriorate. Dr Dorris FetchHendrickson will eval total clinical picture to weigh risk of systemic anticoagulation  Darren Brunsharles Stachia Slutsky, MD [facility] Office: [phone] Pager: [phone]

## 2014-09-06 NOTE — Progress Notes (Signed)
ANTICOAGULATION CONSULT NOTE - Initial Consult  Pharmacy Consult for bivalirudin Indication: potential HIT  No Known Allergies  Patient Measurements: Height: 5\' 7"  (170.2 cm) Weight: 137 lb 12.6 oz (62.5 kg) IBW/kg (Calculated) : 66.1 Heparin Dosing Weight:   Vital Signs: Temp: 97.4 F (36.3 C) (03/11 1535) Temp Source: Oral (03/11 1535) BP: 109/80 mmHg (03/11 1500) Pulse Rate: 79 (03/11 1500)  Labs:  Recent Labs  09/04/14 0500 09/05/14 0512 09/06/14 0405  HGB 10.1* 11.9*  --   HCT 29.2* 35.1*  --   PLT 81* 116*  --   CREATININE 1.37* 1.40* 1.40*    Estimated Creatinine Clearance: 41.5 mL/min (by C-G formula based on Cr of 1.4).   Medical History: Past Medical History  Diagnosis Date  . Hypertension   . Hyperlipidemia   . GERD (gastroesophageal reflux disease)   . Carotid bruit   . Stroke     uses walker  . Shortness of breath dyspnea     with exertion   . Arthritis   . CHF (congestive heart failure) 07/2014    " NEW ONSET "  . S/P CABG x 4   . S/P mitral valve repair     Medications:  Scheduled:  . aspirin EC  325 mg Oral Daily   Or  . aspirin  324 mg Per Tube Daily  . atorvastatin  80 mg Oral q1800  . bisacodyl  10 mg Oral Daily   Or  . bisacodyl  10 mg Rectal Daily  . budesonide-formoterol  2 puff Inhalation BID  . colchicine  0.6 mg Oral BID  . docusate sodium  200 mg Oral Daily  . feeding supplement (ENSURE COMPLETE)  237 mL Oral BID BM  . furosemide  40 mg Oral BID  . insulin aspart  0-9 Units Subcutaneous TID WC  . pantoprazole  40 mg Oral Daily  . sodium chloride  10-40 mL Intracatheter Q12H  . sodium chloride  3 mL Intravenous Q12H  . thiamine  100 mg Oral Daily   Infusions:  . sodium chloride Stopped (08/31/14 0901)  . sodium chloride Stopped (08/30/14 2200)  . sodium chloride Stopped (09/04/14 1600)  . phenylephrine (NEO-SYNEPHRINE) Adult infusion 5 mcg/min (08/30/14 1000)    Assessment: 74 yo male s/p CABG with potential HIT  will be started on bivalirudin per MD's request.  Hgb yesterday was 11.9 and Plt 116 K.  SCr today is 1.4 (CrCl ~42) and AST 77.  INR on 03/03 was 1.5.  Per team, patient didn't get any heparin post op.  Goal of Therapy:  aPTT 50-85 seconds Monitor platelets by anticoagulation protocol: Yes   Plan:  - Start bivalirudin @ 0.1 mg/kg/hr - check aPTT 2 hours after initiation then every 4 hours until therapeutic x2 then daily - consider BMet every 3 days  - f/u on HIT panel -  Darren [name], Darren [name] 09/06/2014,4:43 PM

## 2014-09-06 NOTE — Progress Notes (Signed)
NUTRITION FOLLOW UP  Intervention:   -Continue Ensure Complete po BID, each supplement provides 350 kcal and 13 grams of protein  Nutrition Dx:   Inadequate oral intake related to recent surgery, decreased appetite as evidenced by PO: 30-60%; ongoing  Goal:   Intake to meet >90% of estimated nutrition needs; unmet  Monitor:   PO intake, labs, weight trend.  Assessment:   Patient presented to the hospital on 2/22 with c/o SOB and leg swelling. 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  Pt has been advanced to a heart healthy diet. Staff reports poor appetite- noted 30-60% meal completion. Pt requires a lot of encouragement to eat. Ensure Completed has already been ordered BID- RD will continue with order to maximize nutritional intake. Noted 5# (3.5%) wt loss x 1 week, due to diuresis.  Labs reviewed. K: 3.4 (on supplement), BUN/Creat: 29/1.40, Calcium: 8.1, Mg: 2.8, Glucose: 112. CBGS: 89-120.   Height: Ht Readings from Last 1 Encounters:  08/20/14 5\' 7"  (1.702 m)    Weight Status:   Wt Readings from Last 1 Encounters:  09/06/14 137 lb 12.6 oz (62.5 kg)   08/30/14 142 lb 10.2 oz (64.7 kg)       Re-estimated needs:  Kcal: 1800-2000 Protein: 90-100 grams Fluid: 1.8-2.0 L  Skin: closed surgical incisions to chest and rt leg  Diet Order: Diet Heart   Intake/Output Summary (Last 24 hours) at 09/06/14 1100 Last data filed at 09/06/14 0823  Gross per 24 hour  Intake    230 ml  Output    675 ml  Net   -445 ml    Last BM: 09/06/14   Labs:   Recent Labs Lab 08/30/14 1700  09/04/14 0500 09/05/14 0512 09/06/14 0405  NA  --   < > 132* 136 136  K  --   < > 3.2* 4.4 3.4*  CL  --   < > 106 103 103  CO2  --   < > 23 24 23   BUN  --   < > 33* 34* 29*  CREATININE 1.51*  < > 1.37* 1.40* 1.40*  CALCIUM  --   < > 7.5* 8.4 8.1*  MG 2.8*  --   --   --   --   GLUCOSE  --   < > 74 102* 112*  < > = values in this interval not displayed.  CBG (last 3)   Recent Labs  09/05/14 1710  09/05/14 2149 09/06/14 0743  GLUCAP 120* 116* 89    Scheduled Meds: . aspirin EC  325 mg Oral Daily   Or  . aspirin  324 mg Per Tube Daily  . atorvastatin  80 mg Oral q1800  . bisacodyl  10 mg Oral Daily   Or  . bisacodyl  10 mg Rectal Daily  . budesonide-formoterol  2 puff Inhalation BID  . colchicine  0.6 mg Oral BID  . docusate sodium  200 mg Oral Daily  . feeding supplement (ENSURE COMPLETE)  237 mL Oral BID BM  . furosemide  40 mg Oral BID  . insulin aspart  0-9 Units Subcutaneous TID WC  . pantoprazole  40 mg Oral Daily  . potassium chloride  10 mEq Intravenous Q1 Hr x 3  . sodium chloride  10-40 mL Intracatheter Q12H  . sodium chloride  3 mL Intravenous Q12H  . thiamine  100 mg Oral Daily    Continuous Infusions: . sodium chloride Stopped (08/31/14 0901)  . sodium chloride Stopped (08/30/14 2200)  .  sodium chloride Stopped (09/04/14 1600)  . phenylephrine (NEO-SYNEPHRINE) Adult infusion 5 mcg/min (08/30/14 1000)    Marysue Fait A. Mayford Knife, RD, LDN, CDE Pager: [phone] After hours Pager: [phone]

## 2014-09-06 NOTE — Clinical Social Work Note (Signed)
Patient will need SNF once patient has improved medically.  CSW continuing to follow patient.  Patient has been faxed out, awaiting bed offers to present to patient's family.    Ervin KnackEric R. Elyn Krogh, MSW, Theresia MajorsLCSWA [phone] 09/06/2014 6:02 PM

## 2014-09-06 NOTE — Progress Notes (Signed)
8 Days Post-Op Procedure(s) (LRB): CORONARY ARTERY BYPASS GRAFTING (CABG), ON PUMP, TIMES FOUR, USING LEFT INTERNAL MAMMARY ARTERY, RIGHT GREATER SAPHENOUS VEIN HARVESTED ENDOSCOPICALLY (N/A) MITRAL VALVE REPAIR (MVR) (N/A) TRANSESOPHAGEAL ECHOCARDIOGRAM (TEE) (N/A) Subjective: C/o right foot pain- says it is worse today  Objective: Vital signs in last 24 hours: Temp:  [96.8 F (36 C)-97.6 F (36.4 C)] 97.4 F (36.3 C) (03/11 0402) Pulse Rate:  [44-159] 81 (03/11 0700) Cardiac Rhythm:  [-] Atrial paced (03/11 0400) Resp:  [11-23] 12 (03/11 0700) BP: (92-135)/(50-84) 92/50 mmHg (03/11 0700) SpO2:  [89 %-100 %] 100 % (03/11 0700) Weight:  [137 lb 12.6 oz (62.5 kg)] 137 lb 12.6 oz (62.5 kg) (03/11 0600)  Hemodynamic parameters for last 24 hours:    Intake/Output from previous day: 03/10 0701 - 03/11 0700 In: 330 [P.O.:330] Out: 730 [Urine:730] Intake/Output this shift:    General appearance: alert and mild distress Neurologic: left sided weakness and chronic Heart: regular rate and rhythm Lungs: diminished breath sounds bibasilar Abdomen: normal findings: soft, non-tender Extremities: edema 3+ Right foot tender, swollen, toes mottled, + doppler PT but can't hear a DP  Lab Results:  Recent Labs  09/04/14 0500 09/05/14 0512  WBC 4.3 7.0  HGB 10.1* 11.9*  HCT 29.2* 35.1*  PLT 81* 116*   BMET:  Recent Labs  09/05/14 0512 09/06/14 0405  NA 136 136  K 4.4 3.4*  CL 103 103  CO2 24 23  GLUCOSE 102* 112*  BUN 34* 29*  CREATININE 1.40* 1.40*  CALCIUM 8.4 8.1*    PT/INR: No results for input(s): LABPROT, INR in the last 72 hours. ABG    Component Value Date/Time   PHART 7.449 08/30/2014 0139   HCO3 20.5 08/30/2014 0139   TCO2 17 08/30/2014 1709   ACIDBASEDEF 3.0* 08/30/2014 0139   O2SAT 69.0 08/31/2014 0417   CBG (last 3)   Recent Labs  09/05/14 1639 09/05/14 1710 09/05/14 2149  GLUCAP 101* 120* 116*    Assessment/Plan: S/P Procedure(s)  (LRB): CORONARY ARTERY BYPASS GRAFTING (CABG), ON PUMP, TIMES FOUR, USING LEFT INTERNAL MAMMARY ARTERY, RIGHT GREATER SAPHENOUS VEIN HARVESTED ENDOSCOPICALLY (N/A) MITRAL VALVE REPAIR (MVR) (N/A) TRANSESOPHAGEAL ECHOCARDIOGRAM (TEE) (N/A) -  CV- BP remains soft, maintaining SR  metoprolol dc'ed, will try to put on low dose coreg at some point when BP better  RESP- bilateral effusions, atelectasis  RENAL_ creatinine stable, still volume overloaded- diurese  ENDO- CBG OK  Malnutrition- poor PO intake, encourage PO + supplements  Right foot pain- unclear etiology. Trying to avoid NSAIDs-   ? If possible embolic or ischemic in mature- will ask Vascular to evaluate     LOS: 17 days    Loreli SlotSteven C Afra Tricarico 09/06/2014

## 2014-09-06 NOTE — Progress Notes (Signed)
ANTICOAGULATION CONSULT NOTE -Follow up  Pharmacy Consult for bivalirudin Indication: potential HIT  No Known Allergies  Patient Measurements: Height: 5\' 7"  (170.2 cm) Weight: 137 lb 12.6 oz (62.5 kg) IBW/kg (Calculated) : 66.1 Heparin Dosing Weight:   Vital Signs: Temp: 97.8 F (36.6 C) (03/11 2000) Temp Source: Oral (03/11 2000) BP: 120/83 mmHg (03/11 2200) Pulse Rate: 78 (03/11 2200)  Labs:  Recent Labs  09/04/14 0500 09/05/14 0512 09/06/14 0405 09/06/14 2228  HGB 10.1* 11.9*  --   --   HCT 29.2* 35.1*  --   --   PLT 81* 116*  --   --   APTT  --   --   --  67*  CREATININE 1.37* 1.40* 1.40*  --     Estimated Creatinine Clearance: 41.5 mL/min (by C-G formula based on Cr of 1.4).   Medical History: Past Medical History  Diagnosis Date  . Hypertension   . Hyperlipidemia   . GERD (gastroesophageal reflux disease)   . Carotid bruit   . Stroke     uses walker  . Shortness of breath dyspnea     with exertion   . Arthritis   . CHF (congestive heart failure) 07/2014    " NEW ONSET "  . S/P CABG x 4   . S/P mitral valve repair     Medications:  Scheduled:  . aspirin EC  325 mg Oral Daily   Or  . aspirin  324 mg Per Tube Daily  . atorvastatin  80 mg Oral q1800  . bisacodyl  10 mg Oral Daily   Or  . bisacodyl  10 mg Rectal Daily  . budesonide-formoterol  2 puff Inhalation BID  . colchicine  0.6 mg Oral BID  . docusate sodium  200 mg Oral Daily  . feeding supplement (ENSURE COMPLETE)  237 mL Oral BID BM  . furosemide  40 mg Oral BID  . insulin aspart  0-9 Units Subcutaneous TID WC  . pantoprazole  40 mg Oral Daily  . sodium chloride  10-40 mL Intracatheter Q12H  . sodium chloride  3 mL Intravenous Q12H  . thiamine  100 mg Oral Daily   Infusions:  . sodium chloride Stopped (08/31/14 0901)  . sodium chloride Stopped (08/30/14 2200)  . sodium chloride Stopped (09/04/14 1600)  . bivalirudin (ANGIOMAX) infusion 0.5 mg/mL (Non-ACS indications) 0.1  mg/kg/hr (09/06/14 2200)  . phenylephrine (NEO-SYNEPHRINE) Adult infusion 5 mcg/min (08/30/14 1000)    Assessment: First aPTT is 67 seconds, which is therapeutic on bivalirudin infusion at 0.1 mg/kg/hr in this 74 yo male s/p CABG with potential HIT.  No bleeding reported.    Goal of Therapy:  aPTT 50-85 seconds Monitor platelets by anticoagulation protocol: Yes   Plan:  -Continue bivalirudin @ 0.1 mg/kg/hr - recheck aPTT in 4 hours to confirm therapeutic x2 then changed to once daily aPTT. - consider BMET every 3 days  - f/u on HIT panel   Darren [name] Darren [name], RPh Clinical Pharmacist Pager: [phone]  09/06/2014,11:23 PM

## 2014-09-06 NOTE — Consult Note (Signed)
Hospital Consult    Reason for Consult:  Right foot pain Referring Physician:  Dorris Fetch MRN #:  [id number]  History of Present Illness: This is a 74 y.o. male who presented to the hospital with SOB and leg swelling on 08/23/2014.  He was found to have a NTEMI with elevated troponin. 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  He underwent CABG and MV repair on 13-Sep-2014 by Dr. Dorris Fetch.  He did require Dopamine and Milrinone post operatively.  He did have a bump in his creatinine post operatively to 1.73 and is 1.4 today.  He did have thrombocytopenia to 45k post operatively and heparin was avoided.  His platelet count today is 116k.  SCD's are being used for DVT prophylaxis.  He was noted to have right foot pain on 09/04/14, but did not appear to be ischemic.  It was thought that it could be gout and empiric colchicine was started.  His foot pain has worsened today and VVS is asked to evaluate the pt.    Past Medical History  Diagnosis Date  . Hypertension   . Hyperlipidemia   . GERD (gastroesophageal reflux disease)   . Carotid bruit   . Stroke     uses walker  . Shortness of breath dyspnea     with exertion   . Arthritis   . CHF (congestive heart failure) 07/2014    " NEW ONSET "  . S/P CABG x 4   . S/P mitral valve repair     Past Surgical History  Procedure Laterality Date  . Hernia repair    . Tonsillectomy    . Mouth surgery    . Kyphoplasty N/A 08/07/2014    Procedure:  Lumbar Three Kyphoplasty;  Surgeon: Emilee Hero, MD;  Location: [facility] OR;  Service: Orthopedics;  Laterality: N/A;  Lumbar 3 kyphoplasty  . Left and right heart catheterization with coronary angiogram N/A 08/02/2014    Procedure: LEFT AND RIGHT HEART CATHETERIZATION WITH CORONARY ANGIOGRAM;  Surgeon: Peter M Swaziland, MD;  Location: [facility] CATH LAB;  Service: Cardiovascular;  Laterality: N/A;  . Tee without cardioversion N/A 08/15/2014    Procedure:  TRANSESOPHAGEAL ECHOCARDIOGRAM (TEE);  Surgeon: Vesta Mixer, MD;  Location: [facility] ENDOSCOPY;  Service: Cardiovascular;  Laterality: N/A;  . Coronary artery bypass graft N/A 09/13/14    Procedure: CORONARY ARTERY BYPASS GRAFTING (CABG), ON PUMP, TIMES FOUR, USING LEFT INTERNAL MAMMARY ARTERY, RIGHT GREATER SAPHENOUS VEIN HARVESTED ENDOSCOPICALLY;  Surgeon: Loreli Slot, MD;  Location: [facility] OR;  Service: Open Heart Surgery;  Laterality: N/A;  . Mitral valve repair N/A 2014-09-13    Procedure: MITRAL VALVE REPAIR (MVR);  Surgeon: Loreli Slot, MD;  Location: [facility] OR;  Service: Open Heart Surgery;  Laterality: N/A;  . Tee without cardioversion N/A Sep 13, 2014    Procedure: TRANSESOPHAGEAL ECHOCARDIOGRAM (TEE);  Surgeon: Loreli Slot, MD;  Location: [facility] OR;  Service: Open Heart Surgery;  Laterality: N/A;    No Known Allergies  Prior to Admission medications   Not on File    History   Social History  . Marital Status: Single    Spouse Name: N/A  . Number of Children: N/A  . Years of Education: N/A   Occupational History  . Not on file.   Social History Main Topics  . Smoking status: Current Some Day Smoker -- 0.10 packs/day for 57 years    Types: Cigarettes  . Smokeless tobacco: Never Used  .  Alcohol Use: No  . Drug Use: No  . Sexual Activity: Not on file   Other Topics Concern  . Not on file   Social History Narrative     Family History  Problem Relation Age of Onset  . Alzheimer's disease Mother   . Dementia Mother   . Emphysema Father   . Hypertension Sister   . Hypertension Brother   . Cancer Sister     ROS: [x]  Positive   [ ]  Negative   [ ]  All sytems reviewed and are negative  Cardiovascular: []  chest pain/pressure []  palpitations []  SOB lying flat []  DOE []  pain in legs while walking [x]  pain in right foot at rest []  pain in legs at night []  non-healing ulcers []  hx of DVT [x]  swelling in legs  Pulmonary: []  productive cough []   asthma/wheezing []  home O2  Neurologic: []  weakness in []  arms []  legs []  numbness in []  arms []  legs [x]  hx of CVA []  mini stroke [] difficulty speaking or slurred speech []  temporary loss of vision in one eye []  dizziness  Hematologic: []  hx of cancer []  bleeding problems []  problems with blood clotting easily  Endocrine:   []  diabetes []  thyroid disease  GI []  vomiting blood []  blood in stool  GU: []  CKD/renal failure []  HD--[]  M/W/F or []  T/T/S []  burning with urination []  blood in urine  Psychiatric: []  anxiety []  depression  Musculoskeletal: []  arthritis []  joint pain  Integumentary: []  rashes []  ulcers  Constitutional: []  fever []  chills   Physical Examination  Filed Vitals:   09/06/14 0745  BP:   Pulse:   Temp: 98.9 F (37.2 C)  Resp:    Body mass index is 21.58 kg/(m^2).  General:  WDWN in NAD Gait: Not observed HENT: WNL, normocephalic Pulmonary: normal non-labored breathing, without Rales, rhonchi,  wheezing Cardiac: regular, without  Murmurs, rubs or gallops; without carotid bruits Abdomen: soft, NT/ND, no masses Skin: without rashes, without ulcers  Vascular Exam/Pulses:  Right Left  Radial 1+ (weak) doppler  Ulnar 1+ (weak) doppler  Femoral 1+ (weak) 1+ (weak)  Popliteal Doppler biphasic Doppler biphasic  DP absent absent  PT Doppler monophasic Doppler biphasic   Extremities: with ischemic changes bilateral feet toes and left thumb tip dusky mottling, without Gangrene , without cellulitis; without open wounds;  Musculoskeletal: no muscle wasting or atrophy  Neurologic: A&O X 3; Appropriate Affect ; SENSATION: normal; MOTOR FUNCTION:  moving all extremities equally. Speech is fluent/normal Psychiatric:  Alert and oriented Lymph:  Not palpated   CBC    Component Value Date/Time   WBC 7.0 09/05/2014 0512   RBC 3.93* 09/05/2014 0512   HGB 11.9* 09/05/2014 0512   HCT 35.1* 09/05/2014 0512   PLT 116* 09/05/2014 0512   MCV  89.3 09/05/2014 0512   MCH 30.3 09/05/2014 0512   MCHC 33.9 09/05/2014 0512   RDW 15.3 09/05/2014 0512   LYMPHSABS 1.6 08/11/2014 2052   MONOABS 0.5 07/31/2014 2052   EOSABS 0.0 08/03/2014 2052   BASOSABS 0.0 08/14/2014 2052    BMET    Component Value Date/Time   NA 136 09/06/2014 0405   K 3.4* 09/06/2014 0405   CL 103 09/06/2014 0405   CO2 23 09/06/2014 0405   GLUCOSE 112* 09/06/2014 0405   BUN 29* 09/06/2014 0405   CREATININE 1.40* 09/06/2014 0405   CALCIUM 8.1* 09/06/2014 0405   GFRNONAA 48* 09/06/2014 0405   GFRAA 56* 09/06/2014 0405    COAGS: Lab Results  Component Value Date   INR 1.50* 09/02/2014   INR 1.16 08/22/2014   INR 1.13 08/07/2014     Non-Invasive Vascular Imaging:   ABI pending Venous duplex r/o DVT   Statin:  No. Beta Blocker:  No. Aspirin:  No. ACEI:  No. ARB:  No. Other antiplatelets/anticoagulants:  No.    ASSESSMENT/PLAN: This is a 74 y.o. male With history of lower extremity edema since 08/04/2014.   He reports no history of discoloration.  He has developed mottling of the right foot 3 days ago with pain and today he has mottling of the left foot with pain in the toes.  Left thumb tip is dusky as well as multiple tips.  He reports no pain in the tips.  I will order venous duplex to r/o DVT and ABI's    Thomasena Edis EMMA [facility] PA-C [facility] [phone]    History and exam details as above.  Pt has had progressive worsening and edema of right foot for several days.  He has doppler flow but foot has been cyanotic and painful to the point he cannot bear wt on it. Foot appears viable.  He has similar edema left foot but not as much pain. HIT profile pending.  Non invasive venous and arterial studies pending currently will follow up on these later today.  Fabienne Bruns, MD [facility] [place] Office: [phone] Pager: [phone]

## 2014-09-06 NOTE — Progress Notes (Signed)
Physical Therapy Treatment Patient Details Name: Darren HarrisonRichard M [name] MRN: [id number] DOB: 03/22/1941 Today's Date: 09/06/2014    History of Present Illness 74 y.o. male smoker with HTN, HLD, GERD, CVA, CKD (baseline Cr 1.6), with L3 compression fracture s/p kyphoplasty on 2/10and history of MR who presents with new onset CHF and NSTEMI. S/p CABG 03/03    PT Comments    Functional progress continues to be limited by increased foot pain bilaterally Rt>Lt. Focused on therapeutic exercises today and demonstrates significant generalized weakness today. Max assist to stand although once upright needs min assist for balance. Tolerance to standing limited to 30 seconds and less. Patient will continue to benefit from skilled physical therapy services to further improve independence with functional mobility.   Follow Up Recommendations  SNF (Functional decline 2/2 foot pain, no longer recommend CIR)     Equipment Recommendations  None recommended by PT    Recommendations for Other Services OT consult     Precautions / Restrictions Precautions Precautions: Sternal;Fall Precaution Comments: left foot drop with fatigue Restrictions Weight Bearing Restrictions: Yes Other Position/Activity Restrictions: Sternal    Mobility  Bed Mobility                  Transfers Overall transfer level: Needs assistance Equipment used: Rolling walker (2 wheeled) Transfers: Sit to/from Stand Sit to Stand: Max assist Stand pivot transfers: Mod assist       General transfer comment: Max assist +1 for boost to stand. Leans posteriorly. Tactile cues for upright posture. Cues for sternal precautions with hands lightly on RW for balance. Only tolerated x 30 seconds first trial, 15 seconds second attempt. Requires min assist to maintain balance in this standing position with a rolling walker however fatigues rapidly. Max cues for hand placement to maintain sternal precautions especially when transitioning  from sit<>stand. Mod assist for squat pivots to reposition in chair, lifting bottom several inches to reposition anteriorly and posterior for sit<>stand training.  Ambulation/Gait                 Stairs            Wheelchair Mobility    Modified Rankin (Stroke Patients Only)       Balance                                    Cognition Arousal/Alertness: Awake/alert Behavior During Therapy: WFL for tasks assessed/performed Overall Cognitive Status: Impaired/Different from baseline Area of Impairment: Following commands;Safety/judgement;Problem solving     Memory: Decreased recall of precautions;Decreased short-term memory Following Commands: Follows one step commands consistently Safety/Judgement: Decreased awareness of safety;Decreased awareness of deficits   Problem Solving: Slow processing;Difficulty sequencing;Requires tactile cues;Requires verbal cues;Decreased initiation      Exercises General Exercises - Lower Extremity Ankle Circles/Pumps: AROM;Both;20 reps;Seated Gluteal Sets: Strengthening;Both;10 reps;Seated Long Arc Quad: Strengthening;Both;15 reps;Seated;AAROM Hip Flexion/Marching: Strengthening;AAROM;Both;Seated;15 reps    General Comments General comments (skin integrity, edema, etc.): Rt foot appears more pale than previous therapy session, slightly mottled appearance and edematous. Lt foot also beginning to resemble Rt foot since previous therapy session. HR 80, SpO2 unreadable, BP 92/59      Pertinent Vitals/Pain Pain Assessment: 0-10 Pain Score: 10-Worst pain ever Pain Location: Rt foot>Lt foot Pain Descriptors / Indicators: Pounding;Pins and needles Pain Intervention(s): Limited activity within patient's tolerance;Monitored during session;Repositioned    Home Living  Prior Function            PT Goals (current goals can now be found in the care plan section) Acute Rehab PT Goals Patient  Stated Goal: None stated PT Goal Formulation: With patient Time For Goal Achievement: 09/15/14 Potential to Achieve Goals: Fair Progress towards PT goals: Not progressing toward goals - comment (limited by foot pain)    Frequency  Min 3X/week    PT Plan Discharge plan needs to be updated    Co-evaluation             End of Session Equipment Utilized During Treatment: Gait belt;Oxygen Activity Tolerance: Patient limited by pain;Patient limited by fatigue Patient left: with call bell/phone within reach;in chair     Time: 1610-9604 PT Time Calculation (min) (ACUTE ONLY): 32 min  Charges:  $Therapeutic Exercise: 8-22 mins $Therapeutic Activity: 8-22 mins                    G Codes:      Berton Mount 09/18/2014, 11:13 AM Charlsie Merles, PT [phone]

## 2014-09-06 NOTE — Progress Notes (Signed)
      [address]       [place] [id number]             [phone]      Appreciate Dr. Darrick PennaFields' assistance  ABI 0.7 on R and 0.8 on L  His co-ox is 76% and his creatinine is stable so I do not think it is a low flow situation  There is no clear source for his foot pain. However, he does have some mottling of his feet and his left thumb is blue. I think HITT is a possibility and have ordered a HIT panel. He never got heparin postop, but his PLT count was slow to recover. Will start bivalrudin today and continue while awaiting HIT panel results.  He is not a good candidate for long term anticoagulation but there may not be an alternative

## 2014-09-06 NOTE — Progress Notes (Signed)
  Echocardiogram 2D Echocardiogram has been performed.  Delcie RochENNINGTON, Sophie Tamez 09/06/2014, 5:50 PM

## 2014-09-07 LAB — APTT
APTT: 137 s — AB (ref 24–37)
APTT: 76 s — AB (ref 24–37)
aPTT: 72 seconds — ABNORMAL HIGH (ref 24–37)

## 2014-09-07 LAB — GLUCOSE, CAPILLARY
GLUCOSE-CAPILLARY: 99 mg/dL (ref 70–99)
Glucose-Capillary: 83 mg/dL (ref 70–99)
Glucose-Capillary: 106 mg/dL — ABNORMAL HIGH (ref 70–99)
Glucose-Capillary: 143 mg/dL — ABNORMAL HIGH (ref 70–99)

## 2014-09-07 LAB — BASIC METABOLIC PANEL
Anion gap: 10 (ref 5–15)
BUN: 31 mg/dL — ABNORMAL HIGH (ref 6–23)
CO2: 26 mmol/L (ref 19–32)
Calcium: 8.5 mg/dL (ref 8.4–10.5)
Chloride: 104 mmol/L (ref 96–112)
Creatinine, Ser: 1.4 mg/dL — ABNORMAL HIGH (ref 0.50–1.35)
GFR calc Af Amer: 56 mL/min — ABNORMAL LOW (ref >90)
GFR, EST NON AFRICAN AMERICAN: 48 mL/min — AB (ref >90)
Glucose, Bld: 99 mg/dL (ref 70–99)
POTASSIUM: 3.9 mmol/L (ref 3.5–5.1)
SODIUM: 140 mmol/L (ref 135–145)

## 2014-09-07 LAB — CBC
HCT: 31.3 % — ABNORMAL LOW (ref 39.0–52.0)
Hemoglobin: 10.6 g/dL — ABNORMAL LOW (ref 13.0–17.0)
MCH: 29.7 pg (ref 26.0–34.0)
MCHC: 33.9 g/dL (ref 30.0–36.0)
MCV: 87.7 fL (ref 78.0–100.0)
PLATELETS: 113 10*3/uL — AB (ref 150–400)
RBC: 3.57 MIL/uL — ABNORMAL LOW (ref 4.22–5.81)
RDW: 15.3 % (ref 11.5–15.5)
WBC: 13.1 10*3/uL — ABNORMAL HIGH (ref 4.0–10.5)

## 2014-09-07 NOTE — Progress Notes (Signed)
Patient ID: Darren [name], male   DOB: 06/09/1941, 74 y.o.   MRN: [id number] TCTS DAILY ICU PROGRESS NOTE                   [address]            [place] [id number]          [phone]   9 Days Post-Op Procedure(s) (LRB): CORONARY ARTERY BYPASS GRAFTING (CABG), ON PUMP, TIMES FOUR, USING LEFT INTERNAL MAMMARY ARTERY, RIGHT GREATER SAPHENOUS VEIN HARVESTED ENDOSCOPICALLY (N/A) MITRAL VALVE REPAIR (MVR) (N/A) TRANSESOPHAGEAL ECHOCARDIOGRAM (TEE) (N/A)  Total Length of Stay:  LOS: 18 days   Subjective: Awake and alert , saws rt foot does not hurt as much but still very tender  Objective: Vital signs in last 24 hours: Temp:  [97.4 F (36.3 C)-98.4 F (36.9 C)] 97.9 F (36.6 C) (03/12 0729) Pulse Rate:  [49-108] 79 (03/12 0600) Cardiac Rhythm:  [-] Atrial paced (03/12 0400) Resp:  [13-21] 15 (03/12 0600) BP: (92-129)/(44-86) 121/66 mmHg (03/12 0600) SpO2:  [86 %-100 %] 100 % (03/12 0600)  Filed Weights   09/04/14 0500 09/05/14 0700 09/06/14 0600  Weight: 143 lb 8.3 oz (65.1 kg) 143 lb 1.3 oz (64.9 kg) 137 lb 12.6 oz (62.5 kg)    Weight change:    Hemodynamic parameters for last 24 hours:    Intake/Output from previous day: 03/11 0701 - 03/12 0700 In: 665.2 [P.O.:360; I.V.:155.2; IV Piggyback:150] Out: 690 [Urine:690]  Intake/Output this shift:    Current Meds: Scheduled Meds: . aspirin EC  325 mg Oral Daily   Or  . aspirin  324 mg Per Tube Daily  . atorvastatin  80 mg Oral q1800  . bisacodyl  10 mg Oral Daily   Or  . bisacodyl  10 mg Rectal Daily  . budesonide-formoterol  2 puff Inhalation BID  . colchicine  0.6 mg Oral BID  . docusate sodium  200 mg Oral Daily  . feeding supplement (ENSURE COMPLETE)  237 mL Oral BID BM  . furosemide  40 mg Oral BID  . insulin aspart  0-9 Units Subcutaneous TID WC  . pantoprazole  40 mg Oral Daily  . sodium chloride  10-40 mL Intracatheter Q12H  . sodium chloride  3 mL Intravenous Q12H  . thiamine   100 mg Oral Daily   Continuous Infusions: . sodium chloride Stopped (08/31/14 0901)  . sodium chloride Stopped (08/30/14 2200)  . sodium chloride Stopped (09/04/14 1600)  . bivalirudin (ANGIOMAX) infusion 0.5 mg/mL (Non-ACS indications) 0.1 mg/kg/hr (09/07/14 0700)  . phenylephrine (NEO-SYNEPHRINE) Adult infusion 5 mcg/min (08/30/14 1000)   PRN Meds:.sodium chloride, ipratropium-albuterol, metoprolol, ondansetron (ZOFRAN) IV, sodium chloride, sodium chloride, traMADol  General appearance: alert, cooperative, no distress and slowed mentation Neurologic: intact Heart: regular rate and rhythm, S1, S2 normal, no murmur, click, rub or gallop Lungs: diminished breath sounds bibasilar Abdomen: soft, non-tender; bowel sounds normal; no masses,  no organomegaly Extremities: see photo Wound: sternum stable      Lab Results: CBC: Recent Labs  09/05/14 0512 09/07/14 0323  WBC 7.0 13.1*  HGB 11.9* 10.6*  HCT 35.1* 31.3*  PLT 116* 113*   BMET:  Recent Labs  09/06/14 0405 09/07/14 0323  NA 136 140  K 3.4* 3.9  CL 103 104  CO2 23 26  GLUCOSE 112* 99  BUN 29* 31*  CREATININE 1.40* 1.40*  CALCIUM 8.1* 8.5    PT/INR: No results for input(s): LABPROT, INR in  the last 72 hours.  PTT 72   Radiology: Dg Chest Port 1 View  09/06/2014   CLINICAL DATA:  74 year old male status post CABG. Initial encounter.  EXAM: PORTABLE CHEST - 1 VIEW  COMPARISON:  09/05/2014 and earlier.  FINDINGS: Portable AP semi upright view at 0552 hours. Right PICC line remains in place. Stable cardiac size and mediastinal contours. Continued bilateral veiling opacity at the lung bases. Pulmonary vascularity appears mildly increased. No pneumothorax.  IMPRESSION: 1. Continued bilateral pleural effusions with lower lobe collapse or consolidation. 2. Interval increased pulmonary vascularity.   Electronically Signed   By: Odessa Fleming M.D.   On: 09/06/2014 07:57   Chronic Kidney Disease   Stage I     GFR >90  Stage II     GFR 60-89  Stage IIIA GFR 45-59  Stage IIIB GFR 30-44  Stage IV   GFR 15-29  Stage V    GFR  <15  Lab Results  Component Value Date   CREATININE 1.40* 09/07/2014   Estimated Creatinine Clearance: 41.5 mL/min (by C-G formula based on Cr of 1.4).   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

## 2014-09-07 NOTE — Progress Notes (Signed)
ANTICOAGULATION CONSULT NOTE - Follow Up Consult  Pharmacy Consult for bivalirudin Indication: r/o HIT  Labs:  Recent Labs  09/05/14 0512 09/06/14 0405 09/06/14 2228 09/07/14 0323  HGB 11.9*  --   --  10.6*  HCT 35.1*  --   --  31.3*  PLT 116*  --   --  113*  APTT  --   --  67* 72*  CREATININE 1.40* 1.40*  --  1.40*    Assessment/Plan:  73yo male remains therapeutic on bivalirudin w/ two PTTs in range; since it is still early therapy would prefer not to wait 24hr for next PTT. Will continue gtt at current rate and confirm stable with PTT in 12hr.   Vernard GamblesVeronda Jonee Lamore, PharmD, BCPS  09/07/2014,5:10 AM

## 2014-09-07 NOTE — Progress Notes (Addendum)
   [facility]  Subjective  - Tender to touch still, but he states they feel a little better.   Objective 121/66 79 97.9 F (36.6 C) (Oral) 15 100%  Intake/Output Summary (Last 24 hours) at 09/07/14 0843 Last data filed at 09/07/14 0700  Gross per 24 hour  Intake 615.21 ml  Output    690 ml  Net -74.79 ml    Doppler biphasic DP bilaterally Moderate edema bilateral feet, without leg edema. 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  Has appearance today of atheroembolic event.  Dusky planter bilaterally patches of dusky skin dorsally and edema bilaterally.  Sensation intact, doppler signals  Protect feet.  Continue to observe Pt on Angiomax Follow up HIT Duplex negative for DVT  Fabienne Brunsharles Chaunce Winkels, MD [facility] [place] Office: [phone] Pager: [phone]  Laboratory Lab Results:  Recent Labs  09/05/14 0512 09/07/14 0323  WBC 7.0 13.1*  HGB 11.9* 10.6*  HCT 35.1* 31.3*  PLT 116* 113*   BMET  Recent Labs  09/06/14 0405 09/07/14 0323  NA 136 140  K 3.4* 3.9  CL 103 104  CO2 23 26  GLUCOSE 112* 99  BUN 29* 31*  CREATININE 1.40* 1.40*  CALCIUM 8.1* 8.5    COAG Lab Results  Component Value Date   INR 1.50* 09/02/2014   INR 1.16 August 28, 2014   INR 1.13 08/07/2014   No results found for: PTT

## 2014-09-07 NOTE — Progress Notes (Signed)
Pharmacist Heart Failure Core Measure Documentation  Assessment: Darren HarrisonRichard M [name] has an EF documented as 25% on 09/06/14 by ECHO.  Rationale: Heart failure patients with left ventricular systolic dysfunction (LVSD) and an EF < 40% should be prescribed an angiotensin converting enzyme inhibitor (ACEI) or angiotensin receptor blocker (ARB) at discharge unless a contraindication is documented in the medical record.  This patient is not currently on an ACEI or ARB for HF.  This note is being placed in the record in order to provide documentation that a contraindication to the use of these agents is present for this encounter.  ACE Inhibitor or Angiotensin Receptor Blocker is contraindicated (specify all that apply)  []   ACEI allergy AND ARB allergy []   Angioedema []   Moderate or severe aortic stenosis []   Hyperkalemia []   Hypotension []   Renal artery stenosis [x]   Worsening renal function, preexisting renal disease or dysfunction   Darren CandleCarney, Darren [name] 09/07/2014 3:11 PM

## 2014-09-07 NOTE — Progress Notes (Signed)
ANTICOAGULATION CONSULT NOTE -Follow up  Pharmacy Consult for bivalirudin Indication: potential HIT  No Known Allergies  Patient Measurements: Height: 5\' 7"  (170.2 cm) Weight: 137 lb 12.6 oz (62.5 kg) IBW/kg (Calculated) : 66.1 Heparin Dosing Weight:   Vital Signs: Temp: 98.4 F (36.9 C) (03/12 2000) Temp Source: Oral (03/12 2000) BP: 107/65 mmHg (03/12 2000) Pulse Rate: 80 (03/12 2000)  Labs:  Recent Labs  09/05/14 0512 09/06/14 0405  09/07/14 0323 09/07/14 1600 09/07/14 2021  HGB 11.9*  --   --  10.6*  --   --   HCT 35.1*  --   --  31.3*  --   --   PLT 116*  --   --  113*  --   --   APTT  --   --   < > 72* 137* 76*  CREATININE 1.40* 1.40*  --  1.40*  --   --   < > = values in this interval not displayed.  Estimated Creatinine Clearance: 41.5 mL/min (by C-G formula based on Cr of 1.4).   Medical History: Past Medical History  Diagnosis Date  . Hypertension   . Hyperlipidemia   . GERD (gastroesophageal reflux disease)   . Carotid bruit   . Stroke     uses walker  . Shortness of breath dyspnea     with exertion   . Arthritis   . CHF (congestive heart failure) 07/2014    " NEW ONSET "  . S/P CABG x 4   . S/P mitral valve repair     Medications:  Scheduled:  . aspirin EC  325 mg Oral Daily   Or  . aspirin  324 mg Per Tube Daily  . atorvastatin  80 mg Oral q1800  . bisacodyl  10 mg Oral Daily   Or  . bisacodyl  10 mg Rectal Daily  . budesonide-formoterol  2 puff Inhalation BID  . colchicine  0.6 mg Oral BID  . docusate sodium  200 mg Oral Daily  . feeding supplement (ENSURE COMPLETE)  237 mL Oral BID BM  . furosemide  40 mg Oral BID  . insulin aspart  0-9 Units Subcutaneous TID WC  . pantoprazole  40 mg Oral Daily  . sodium chloride  10-40 mL Intracatheter Q12H  . sodium chloride  3 mL Intravenous Q12H  . thiamine  100 mg Oral Daily   Infusions:  . sodium chloride Stopped (08/31/14 0901)  . sodium chloride Stopped (08/30/14 2200)  . sodium  chloride Stopped (09/04/14 1600)  . bivalirudin (ANGIOMAX) infusion 0.5 mg/mL (Non-ACS indications) 0.1 mg/kg/hr (09/07/14 2000)  . phenylephrine (NEO-SYNEPHRINE) Adult infusion 5 mcg/min (08/30/14 1000)    Assessment: First 2 PTTs therapeutic on bivalirudin infusion at 0.1 mg/kg/hr in this 74 yo male s/p CABG with potential HIT.  No bleeding reported.  Next PTT reported as 137, but confirmed with RN, was drawn from PICC where bivalirudin was infusing in the opposite lumen.  Reordered peripheral stick, and PTT remains at goal at 76.    Goal of Therapy:  aPTT 50-85 seconds Monitor platelets by anticoagulation protocol: Yes   Plan:  -Continue bivalirudin @ 0.1 mg/kg/hr - Daily PTT. - consider BMET every 3 days  - f/u on HIT panel  Tad MooreJessica Sherryl Valido, Pharm D, BCPS  Clinical Pharmacist Pager [phone]  09/07/2014 9:11 PM

## 2014-09-07 NOTE — Progress Notes (Signed)
Patient ID: Darren [name], male   DOB: 01/29/1941, 74 y.o.   MRN: [id number] EVENING ROUNDS NOTE :     [address]       [place] [id number]             [phone]                 9 Days Post-Op Procedure(s) (LRB): CORONARY ARTERY BYPASS GRAFTING (CABG), ON PUMP, TIMES FOUR, USING LEFT INTERNAL MAMMARY ARTERY, RIGHT GREATER SAPHENOUS VEIN HARVESTED ENDOSCOPICALLY (N/A) MITRAL VALVE REPAIR (MVR) (N/A) TRANSESOPHAGEAL ECHOCARDIOGRAM (TEE) (N/A)  Total Length of Stay:  LOS: 18 days  BP 115/68 mmHg  Pulse 81  Temp(Src) 98 F (36.7 C) (Oral)  Resp 17  Ht 5\' 7"  (1.702 m)  Wt 137 lb 12.6 oz (62.5 kg)  BMI 21.58 kg/m2  SpO2 95%  .Intake/Output      03/11 0701 - 03/12 0700 03/12 0701 - 03/13 0700   P.O. 360 600   I.V. (mL/kg) 155.2 (2.5) 100 (1.6)   IV Piggyback 150    Total Intake(mL/kg) 665.2 (10.6) 700 (11.2)   Urine (mL/kg/hr) 690 (0.5) 100 (0.1)   Stool 0 (0)    Total Output 690 100   Net -24.8 +600        Urine Occurrence 2 x    Stool Occurrence 1 x      . sodium chloride Stopped (08/31/14 0901)  . sodium chloride Stopped (08/30/14 2200)  . sodium chloride Stopped (09/04/14 1600)  . bivalirudin (ANGIOMAX) infusion 0.5 mg/mL (Non-ACS indications) 0.1 mg/kg/hr (09/07/14 1500)  . phenylephrine (NEO-SYNEPHRINE) Adult infusion 5 mcg/min (08/30/14 1000)     Lab Results  Component Value Date   WBC 13.1* 09/07/2014   HGB 10.6* 09/07/2014   HCT 31.3* 09/07/2014   PLT 113* 09/07/2014   GLUCOSE 99 09/07/2014   ALT 58* 09/06/2014   AST 77* 09/06/2014   NA 140 09/07/2014   K 3.9 09/07/2014   CL 104 09/07/2014   CREATININE 1.40* 09/07/2014   BUN 31* 09/07/2014   CO2 26 09/07/2014   TSH 3.373 08/20/2014   INR 1.50* 09/22/2014   HGBA1C 5.6 08/28/2014   Increasing skin chages in feet right >left  ptt 137  Delight OvensEdward B Jonell Brumbaugh MD  Beeper [phone] Office [phone] 09/07/2014 6:01 PM

## 2014-09-08 ENCOUNTER — Inpatient Hospital Stay (HOSPITAL_COMMUNITY): Payer: Medicare Other

## 2014-09-08 LAB — GLUCOSE, CAPILLARY
GLUCOSE-CAPILLARY: 155 mg/dL — AB (ref 70–99)
GLUCOSE-CAPILLARY: 104 mg/dL — AB (ref 70–99)
GLUCOSE-CAPILLARY: 114 mg/dL — AB (ref 70–99)
Glucose-Capillary: 72 mg/dL (ref 70–99)
Glucose-Capillary: 136 mg/dL — ABNORMAL HIGH (ref 70–99)

## 2014-09-08 LAB — CBC
HCT: 28.9 % — ABNORMAL LOW (ref 39.0–52.0)
Hemoglobin: 9.9 g/dL — ABNORMAL LOW (ref 13.0–17.0)
MCH: 29.9 pg (ref 26.0–34.0)
MCHC: 34.3 g/dL (ref 30.0–36.0)
MCV: 87.3 fL (ref 78.0–100.0)
PLATELETS: 144 10*3/uL — AB (ref 150–400)
RBC: 3.31 MIL/uL — AB (ref 4.22–5.81)
RDW: 15.4 % (ref 11.5–15.5)
WBC: 15.7 10*3/uL — ABNORMAL HIGH (ref 4.0–10.5)

## 2014-09-08 LAB — BASIC METABOLIC PANEL
Anion gap: 10 (ref 5–15)
BUN: 32 mg/dL — ABNORMAL HIGH (ref 6–23)
CO2: 24 mmol/L (ref 19–32)
Calcium: 7.7 mg/dL — ABNORMAL LOW (ref 8.4–10.5)
Chloride: 104 mmol/L (ref 96–112)
Creatinine, Ser: 1.41 mg/dL — ABNORMAL HIGH (ref 0.50–1.35)
GFR calc Af Amer: 56 mL/min — ABNORMAL LOW (ref >90)
GFR calc non Af Amer: 48 mL/min — ABNORMAL LOW (ref >90)
Glucose, Bld: 77 mg/dL (ref 70–99)
Potassium: 3.9 mmol/L (ref 3.5–5.1)
Sodium: 138 mmol/L (ref 135–145)

## 2014-09-08 LAB — APTT: aPTT: 69 seconds — ABNORMAL HIGH (ref 24–37)

## 2014-09-08 NOTE — Progress Notes (Signed)
Daughter updated.  Pt apparently only consented to his heart operation reluctantly.  Pt daughter says she is not surprised he is refusing amputation.  We will continue to follow  [name], MD [facility] [place] Office: [phone] Pager: [phone]

## 2014-09-08 NOTE — Progress Notes (Signed)
Patient ID: Darren [name], male   DOB: 05/01/1941, 74 y.o.   MRN: [id number] TCTS DAILY ICU PROGRESS NOTE                   [address]            [place] [id number]          [phone]   10 Days Post-Op Procedure(s) (LRB): CORONARY ARTERY BYPASS GRAFTING (CABG), ON PUMP, TIMES FOUR, USING LEFT INTERNAL MAMMARY ARTERY, RIGHT GREATER SAPHENOUS VEIN HARVESTED ENDOSCOPICALLY (N/A) MITRAL VALVE REPAIR (MVR) (N/A) TRANSESOPHAGEAL ECHOCARDIOGRAM (TEE) (N/A)  Total Length of Stay:  LOS: 19 days   Subjective: Dr Darrick PennaFields discussed amputation of right foot with patient this am  Objective: Vital signs in last 24 hours: Temp:  [97.5 F (36.4 C)-99.5 F (37.5 C)] 99.5 F (37.5 C) (03/13 0400) Pulse Rate:  [25-146] 80 (03/13 0700) Cardiac Rhythm:  [-] Atrial paced (03/13 0800) Resp:  [10-27] 12 (03/13 0700) BP: (91-118)/(60-80) 112/76 mmHg (03/13 0800) SpO2:  [80 %-100 %] 99 % (03/13 0804) Weight:  [146 lb 9.7 oz (66.5 kg)] 146 lb 9.7 oz (66.5 kg) (03/13 0500)  Filed Weights   09/05/14 0700 09/06/14 0600 09/08/14 0500  Weight: 143 lb 1.3 oz (64.9 kg) 137 lb 12.6 oz (62.5 kg) 146 lb 9.7 oz (66.5 kg)    Weight change:    Hemodynamic parameters for last 24 hours:    Intake/Output from previous day: 03/12 0701 - 03/13 0700 In: 1405 [P.O.:1130; I.V.:275] Out: 965 [Urine:965]  Intake/Output this shift: Total I/O In: 72.5 [P.O.:60; I.V.:12.5] Out: -   Current Meds: Scheduled Meds: . aspirin EC  325 mg Oral Daily   Or  . aspirin  324 mg Per Tube Daily  . atorvastatin  80 mg Oral q1800  . bisacodyl  10 mg Oral Daily   Or  . bisacodyl  10 mg Rectal Daily  . budesonide-formoterol  2 puff Inhalation BID  . colchicine  0.6 mg Oral BID  . docusate sodium  200 mg Oral Daily  . feeding supplement (ENSURE COMPLETE)  237 mL Oral BID BM  . furosemide  40 mg Oral BID  . insulin aspart  0-9 Units Subcutaneous TID WC  . pantoprazole  40 mg Oral Daily  . sodium  chloride  10-40 mL Intracatheter Q12H  . sodium chloride  3 mL Intravenous Q12H  . thiamine  100 mg Oral Daily   Continuous Infusions: . sodium chloride Stopped (08/31/14 0901)  . sodium chloride Stopped (08/30/14 2200)  . sodium chloride Stopped (09/04/14 1600)  . bivalirudin (ANGIOMAX) infusion 0.5 mg/mL (Non-ACS indications) 0.1 mg/kg/hr (09/08/14 0800)  . phenylephrine (NEO-SYNEPHRINE) Adult infusion 5 mcg/min (08/30/14 1000)   PRN Meds:.sodium chloride, ipratropium-albuterol, metoprolol, ondansetron (ZOFRAN) IV, sodium chloride, sodium chloride, traMADol  General appearance: alert, cooperative, appears older than stated age and mild distress Neurologic: intact Heart: regular rate and rhythm, S1, S2 normal, no murmur, click, rub or gallop Lungs: diminished breath sounds bibasilar Abdomen: soft, non-tender; bowel sounds normal; no masses,  no organomegaly Extremities: see photo  Wound: sternum stable   Dg Chest Port 1 View  09/08/2014   CLINICAL DATA:  Nine days post CABG and MVR, history hypertension, stroke, CHF  EXAM: PORTABLE CHEST - 1 VIEW  COMPARISON:  Portable exam 0535 hr compared to 09/06/2014  FINDINGS: RIGHT arm PICC line tip projects over cavoatrial junction.  Enlargement of cardiac silhouette post CABG and MVR.  Mediastinal contours and  pulmonary vascularity normal.  Bibasilar effusions greater on RIGHT.  Mild RIGHT basilar atelectasis.  Upper lungs clear.  No pneumothorax.  IMPRESSION: Bibasilar effusions and atelectasis greater on RIGHT.  Enlargement of cardiac silhouette post CABG and MVR.   Electronically Signed   By: Ulyses Southward M.D.   On: 09/08/2014 07:54    Lab Results: CBC:  Recent Labs  09/07/14 0323 09/08/14 0342  WBC 13.1* 15.7*  HGB 10.6* 9.9*  HCT 31.3* 28.9*  PLT 113* 144*   BMET:   Recent Labs  09/07/14 0323 09/08/14 0342  NA 140 138  K 3.9 3.9  CL 104 104  CO2 26 24  GLUCOSE 99 77  BUN 31* 32*  CREATININE 1.40* 1.41*  CALCIUM 8.5 7.7*     ptt 69  PT/INR: No results for input(s): LABPROT, INR in the last 72 hours. Radiology: No results found.   Assessment/Plan: S/P Procedure(s) (LRB): CORONARY ARTERY BYPASS GRAFTING (CABG), ON PUMP, TIMES FOUR, USING LEFT INTERNAL MAMMARY ARTERY, RIGHT GREATER SAPHENOUS VEIN HARVESTED ENDOSCOPICALLY (N/A) MITRAL VALVE REPAIR (MVR) (N/A) TRANSESOPHAGEAL ECHOCARDIOGRAM (TEE) (N/A) plt count increasing, PTT elevated on angiomax Cr stable, feet still tender rt > left. Edema much improved Current patient refuses amputation.    [name] 09/08/2014 10:24 AM

## 2014-09-08 NOTE — Progress Notes (Signed)
ANTICOAGULATION CONSULT NOTE -Follow up  Pharmacy Consult for bivalirudin Indication: potential HIT  No Known Allergies  Patient Measurements: Height: 5\' 7"  (170.2 cm) Weight: 146 lb 9.7 oz (66.5 kg) IBW/kg (Calculated) : 66.1 Heparin Dosing Weight:   Vital Signs: Temp: 99.5 F (37.5 C) (03/13 0400) Temp Source: Oral (03/13 0000) BP: 112/76 mmHg (03/13 0800) Pulse Rate: 80 (03/13 0700)  Labs:  Recent Labs  09/06/14 0405  09/07/14 0323 09/07/14 1600 09/07/14 2021 09/08/14 0342  HGB  --   --  10.6*  --   --  9.9*  HCT  --   --  31.3*  --   --  28.9*  PLT  --   --  113*  --   --  144*  APTT  --   < > 72* 137* 76* 69*  CREATININE 1.40*  --  1.40*  --   --  1.41*  < > = values in this interval not displayed.  Estimated Creatinine Clearance: 43.6 mL/min (by C-G formula based on Cr of 1.41).   Medical History: Past Medical History  Diagnosis Date  . Hypertension   . Hyperlipidemia   . GERD (gastroesophageal reflux disease)   . Carotid bruit   . Stroke     uses walker  . Shortness of breath dyspnea     with exertion   . Arthritis   . CHF (congestive heart failure) 07/2014    " NEW ONSET "  . S/P CABG x 4   . S/P mitral valve repair     Medications:  Scheduled:  . aspirin EC  325 mg Oral Daily   Or  . aspirin  324 mg Per Tube Daily  . atorvastatin  80 mg Oral q1800  . bisacodyl  10 mg Oral Daily   Or  . bisacodyl  10 mg Rectal Daily  . budesonide-formoterol  2 puff Inhalation BID  . colchicine  0.6 mg Oral BID  . docusate sodium  200 mg Oral Daily  . feeding supplement (ENSURE COMPLETE)  237 mL Oral BID BM  . furosemide  40 mg Oral BID  . insulin aspart  0-9 Units Subcutaneous TID WC  . pantoprazole  40 mg Oral Daily  . sodium chloride  10-40 mL Intracatheter Q12H  . sodium chloride  3 mL Intravenous Q12H  . thiamine  100 mg Oral Daily   Infusions:  . sodium chloride Stopped (08/31/14 0901)  . sodium chloride Stopped (08/30/14 2200)  . sodium  chloride Stopped (09/04/14 1600)  . bivalirudin (ANGIOMAX) infusion 0.5 mg/mL (Non-ACS indications) 0.1 mg/kg/hr (09/08/14 0800)  . phenylephrine (NEO-SYNEPHRINE) Adult infusion 5 mcg/min (08/30/14 1000)    Assessment: First 2 PTTs therapeutic on bivalirudin infusion at 0.1 mg/kg/hr in this 74 yo male s/p CABG with potential HIT.  No bleeding reported.  AM PTT remains at goal.  No bleeding or complications noted, HIT panel pending.  Platelet count continues to rise.   Goal of Therapy:  aPTT 50-85 seconds Monitor platelets by anticoagulation protocol: Yes   Plan:  -Continue bivalirudin @ 0.1 mg/kg/hr - Daily PTT. - f/u on HIT panel  Tad MooreJessica Turki Tapanes, Pharm D, BCPS  Clinical Pharmacist Pager [phone]  09/08/2014 10:55 AM

## 2014-09-08 NOTE — Consult Note (Addendum)
Right foot very painful today.  Prefers to keep it rotated medially.  Leg is weak to the point he can barely rotate it externally Left foot overall improved color Edema both feet improved  Right foot is probably not salvageable at this point Most likely either a HIT or atherembolic event.  He has doppler signals in the foot and adequate perfusion to the ankle level Discussed right BKA with pt today and he states he would rather be euthanized than lose his leg.  He will think about BKA a little more today  Continue Angiomax Protect foot  If patient wishes to proceed with BKA please make NPO p midnight  Fabienne Brunsharles Orelia Brandstetter, MD [facility] [place] Office: [phone] Pager: [phone]

## 2014-09-08 NOTE — Progress Notes (Signed)
Patient ID: Darren [name], male   DOB: 09/15/1940, 74 y.o.   MRN: [id number] EVENING ROUNDS NOTE :     [address]       [place] [id number]             [phone]                 10 Days Post-Op Procedure(s) (LRB): CORONARY ARTERY BYPASS GRAFTING (CABG), ON PUMP, TIMES FOUR, USING LEFT INTERNAL MAMMARY ARTERY, RIGHT GREATER SAPHENOUS VEIN HARVESTED ENDOSCOPICALLY (N/A) MITRAL VALVE REPAIR (MVR) (N/A) TRANSESOPHAGEAL ECHOCARDIOGRAM (TEE) (N/A)  Total Length of Stay:  LOS: 19 days  BP 100/64 mmHg  Pulse 76  Temp(Src) 97.9 F (36.6 C) (Oral)  Resp 12  Ht 5\' 7"  (1.702 m)  Wt 146 lb 9.7 oz (66.5 kg)  BMI 22.96 kg/m2  SpO2 99%  .Intake/Output      03/13 0701 - 03/14 0700   P.O. 1020   I.V. (mL/kg) 137.5 (2.1)   Total Intake(mL/kg) 1157.5 (17.4)   Urine (mL/kg/hr) 450 (0.6)   Stool 0 (0)   Total Output 450   Net +707.5       Stool Occurrence 1 x     . sodium chloride Stopped (08/31/14 0901)  . sodium chloride Stopped (08/30/14 2200)  . sodium chloride Stopped (09/04/14 1600)  . bivalirudin (ANGIOMAX) infusion 0.5 mg/mL (Non-ACS indications) 0.1 mg/kg/hr (09/08/14 1800)  . phenylephrine (NEO-SYNEPHRINE) Adult infusion 5 mcg/min (08/30/14 1000)     Lab Results  Component Value Date   WBC 15.7* 09/08/2014   HGB 9.9* 09/08/2014   HCT 28.9* 09/08/2014   PLT 144* 09/08/2014   GLUCOSE 77 09/08/2014   ALT 58* 09/06/2014   AST 77* 09/06/2014   NA 138 09/08/2014   K 3.9 09/08/2014   CL 104 09/08/2014   CREATININE 1.41* 09/08/2014   BUN 32* 09/08/2014   CO2 24 09/08/2014   TSH 3.373 08/20/2014   INR 1.50* 09/19/2014   HGBA1C 5.6 08/28/2014   Stable day, taking po diet supplements , family in room, patient not sure he is agreeable with amputation , notes his wife had amputation  Before she died   Delight Ovensdward B Dinah Lupa MD  Beeper [phone] Office [phone] 09/08/2014 7:05 PM

## 2014-09-09 ENCOUNTER — Inpatient Hospital Stay (HOSPITAL_COMMUNITY): Payer: Medicare Other

## 2014-09-09 DIAGNOSIS — Z951 Presence of aortocoronary bypass graft: Secondary | ICD-10-CM

## 2014-09-09 LAB — APTT
APTT: 48 s — AB (ref 24–37)
aPTT: 72 seconds — ABNORMAL HIGH (ref 24–37)

## 2014-09-09 LAB — CBC
HEMATOCRIT: 32.3 % — AB (ref 39.0–52.0)
Hemoglobin: 11.1 g/dL — ABNORMAL LOW (ref 13.0–17.0)
MCH: 30.4 pg (ref 26.0–34.0)
MCHC: 34.4 g/dL (ref 30.0–36.0)
MCV: 88.5 fL (ref 78.0–100.0)
PLATELETS: 144 10*3/uL — AB (ref 150–400)
RBC: 3.65 MIL/uL — ABNORMAL LOW (ref 4.22–5.81)
RDW: 15.5 % (ref 11.5–15.5)
WBC: 15.7 10*3/uL — AB (ref 4.0–10.5)

## 2014-09-09 LAB — HEPARIN INDUCED THROMBOCYTOPENIA PNL: Heparin Induced Plt Ab: 2.955 OD — ABNORMAL HIGH (ref 0.000–0.400)

## 2014-09-09 LAB — GLUCOSE, CAPILLARY
GLUCOSE-CAPILLARY: 104 mg/dL — AB (ref 70–99)
GLUCOSE-CAPILLARY: 150 mg/dL — AB (ref 70–99)
Glucose-Capillary: 86 mg/dL (ref 70–99)
Glucose-Capillary: 80 mg/dL (ref 70–99)

## 2014-09-09 LAB — SEROTONIN RELEASE ASSAY (SRA)
SRA .2 IU/mL UFH Ser-aCnc: 93 % — ABNORMAL HIGH (ref 0–20)
SRA 100IU/mL UFH Ser-aCnc: <1 % (ref 0–20)

## 2014-09-09 NOTE — Progress Notes (Signed)
This is the on service note for rounding team B. The chart is been reviewed. The patient status post bypass surgery for three-vessel coronary disease which included saphenous vein graft to the ramus intermedius saphenous vein graft to the obtuse marginal saphenous vein graft to the PDA and internal mammary to the LAD. In addition the patient had a complex mitral valve repair. Now the patient is 10 days post surgery and has an ischemic right foot possibly related to heparin-induced thrombocytopenia versus atheroembolic disease. He has chronic systolic heart failure and appears to be euvolemic. We will follow and help as needed.

## 2014-09-09 NOTE — Progress Notes (Signed)
T[facility] BRIEF SICU PROGRESS NOTE  11 Days Post-Op  S/P Procedure(s) (LRB): CORONARY ARTERY BYPASS GRAFTING (CABG), ON PUMP, TIMES FOUR, USING LEFT INTERNAL MAMMARY ARTERY, RIGHT GREATER SAPHENOUS VEIN HARVESTED ENDOSCOPICALLY (N/A) MITRAL VALVE REPAIR (MVR) (N/A) TRANSESOPHAGEAL ECHOCARDIOGRAM (TEE) (N/A)   Stable day HITT assay positive  Plan: Continue current plan  Darren [name] 09/09/2014 8:56 PM

## 2014-09-09 NOTE — Progress Notes (Signed)
11 Days Post-Op Procedure(s) (LRB): CORONARY ARTERY BYPASS GRAFTING (CABG), ON PUMP, TIMES FOUR, USING LEFT INTERNAL MAMMARY ARTERY, RIGHT GREATER SAPHENOUS VEIN HARVESTED ENDOSCOPICALLY (N/A) MITRAL VALVE REPAIR (MVR) (N/A) TRANSESOPHAGEAL ECHOCARDIOGRAM (TEE) (N/A) Subjective: Feels better today  Objective: Vital signs in last 24 hours: Temp:  [97.5 F (36.4 C)-98.4 F (36.9 C)] 98.4 F (36.9 C) (03/14 0400) Pulse Rate:  [65-91] 80 (03/14 0700) Cardiac Rhythm:  [-] Atrial paced (03/14 0400) Resp:  [9-25] 12 (03/14 0700) BP: (88-117)/(58-83) 89/58 mmHg (03/14 0700) SpO2:  [86 %-100 %] 100 % (03/14 0725) Weight:  [144 lb 2.9 oz (65.4 kg)] 144 lb 2.9 oz (65.4 kg) (03/14 0500)  Hemodynamic parameters for last 24 hours:    Intake/Output from previous day: 03/13 0701 - 03/14 0700 In: 1320 [P.O.:1020; I.V.:300] Out: 1025 [Urine:1025] Intake/Output this shift:    General appearance: alert, cooperative and no distress Neurologic: intact Heart: regular rate and rhythm Lungs: diminished breath sounds bibasilar Extremities: left thumb improved, right LE demarcateed mid foot  Lab Results:  Recent Labs  09/08/14 0342 09/09/14 0330  WBC 15.7* 15.7*  HGB 9.9* 11.1*  HCT 28.9* 32.3*  PLT 144* 144*   BMET:  Recent Labs  09/07/14 0323 09/08/14 0342  NA 140 138  K 3.9 3.9  CL 104 104  CO2 26 24  GLUCOSE 99 77  BUN 31* 32*  CREATININE 1.40* 1.41*  CALCIUM 8.5 7.7*    PT/INR: No results for input(s): LABPROT, INR in the last 72 hours. ABG    Component Value Date/Time   PHART 7.449 08/30/2014 0139   HCO3 20.5 08/30/2014 0139   TCO2 17 08/30/2014 1709   ACIDBASEDEF 3.0* 08/30/2014 0139   O2SAT 76.6 09/06/2014 1510   CBG (last 3)   Recent Labs  09/08/14 1729 09/08/14 1955 09/08/14 2145  GLUCAP 104* 114* 136*    Assessment/Plan: S/P Procedure(s) (LRB): CORONARY ARTERY BYPASS GRAFTING (CABG), ON PUMP, TIMES FOUR, USING LEFT INTERNAL MAMMARY ARTERY, RIGHT  GREATER SAPHENOUS VEIN HARVESTED ENDOSCOPICALLY (N/A) MITRAL VALVE REPAIR (MVR) (N/A) TRANSESOPHAGEAL ECHOCARDIOGRAM (TEE) (N/A) -  CARDIAC- stable from cardiac standpoint  RESP- bilateral pleural effusions and bibasilar atelectasis-  will probably need thoracentesis at some point but continue with diuresis for now  RENAL- creatinine at baseline yesterday  VASCULAR- ischemic right foot, Has demarcated distally.   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

## 2014-09-09 NOTE — Progress Notes (Signed)
ANTICOAGULATION CONSULT NOTE - Follow Up Consult  Pharmacy Consult for Bivalirudin Indication: potential HIT  Allergies  Allergen Reactions  . 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  Estimated Creatinine Clearance: 43.2 mL/min (by C-G formula based on Cr of 1.41).  Assessment:  aPTT is 72 seconds on 0.12 mg/kg/hr. Therapauetic.  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  HIT panel from 09/06/14 still in process.  Platelet count up to 144 today.  Goal of Therapy:  aPTT 50-85 seconds Monitor platelets by anticoagulation protocol: Yes   Plan:   Continue Bivalirudin at 0.12 mg/kg/hr.  Daily aPTT and CBC.   Follow up 2nd HIT panel.  Dennie Fettersgan, Sumiko Ceasar Donovan, [place] Pager: [phone] 09/09/2014,5:48 PM

## 2014-09-09 NOTE — Progress Notes (Signed)
ANTICOAGULATION CONSULT NOTE -Follow up  Pharmacy Consult for bivalirudin Indication: potential HIT  No Known Allergies  Patient Measurements: Height: 5\' 7"  (170.2 cm) Weight: 144 lb 2.9 oz (65.4 kg) IBW/kg (Calculated) : 66.1  Vital Signs: Temp: 97.5 F (36.4 C) (03/14 0900) Temp Source: Oral (03/14 0900) BP: 97/65 mmHg (03/14 0800) Pulse Rate: 83 (03/14 0800)  Labs:  Recent Labs  09/07/14 0323  09/07/14 2021 09/08/14 0342 09/09/14 0330  HGB 10.6*  --   --  9.9* 11.1*  HCT 31.3*  --   --  28.9* 32.3*  PLT 113*  --   --  144* 144*  APTT 72*  < > 76* 69* 48*  CREATININE 1.40*  --   --  1.41*  --   < > = values in this interval not displayed.  Estimated Creatinine Clearance: 43.2 mL/min (by C-G formula based on Cr of 1.41).   Medical History: Past Medical History  Diagnosis Date  . Hypertension   . Hyperlipidemia   . GERD (gastroesophageal reflux disease)   . Carotid bruit   . Stroke     uses walker  . Shortness of breath dyspnea     with exertion   . Arthritis   . CHF (congestive heart failure) 07/2014    " NEW ONSET "  . S/P CABG x 4   . S/P mitral valve repair     Medications:  Scheduled:  . aspirin EC  325 mg Oral Daily   Or  . aspirin  324 mg Per Tube Daily  . atorvastatin  80 mg Oral q1800  . bisacodyl  10 mg Oral Daily   Or  . bisacodyl  10 mg Rectal Daily  . budesonide-formoterol  2 puff Inhalation BID  . colchicine  0.6 mg Oral BID  . docusate sodium  200 mg Oral Daily  . feeding supplement (ENSURE COMPLETE)  237 mL Oral BID BM  . furosemide  40 mg Oral BID  . insulin aspart  0-9 Units Subcutaneous TID WC  . pantoprazole  40 mg Oral Daily  . sodium chloride  10-40 mL Intracatheter Q12H  . sodium chloride  3 mL Intravenous Q12H  . thiamine  100 mg Oral Daily   Infusions:  . sodium chloride Stopped (08/31/14 0901)  . sodium chloride Stopped (08/30/14 2200)  . sodium chloride Stopped (09/04/14 1600)  . bivalirudin (ANGIOMAX) infusion  0.5 mg/mL (Non-ACS indications) 0.1 mg/kg/hr (09/09/14 0800)  . phenylephrine (NEO-SYNEPHRINE) Adult infusion 5 mcg/min (08/30/14 1000)    Assessment: 74 yo male s/p CABG with potential HIT.  No bleeding reported.  AM PTT now sub-therapeutic after multiple levels at goal. RN states the drip was not held for any reason. No bleeding or complications noted, HIT panel pending.  Hgb and Platelet count continues to rise.   Goal of Therapy:  aPTT 50-85 seconds Monitor platelets by anticoagulation protocol: Yes   Plan:  -Increase bivalirudin to 0.12 mg/kg/hr. F/u 4 hr PTT  - Daily PTT. - f/u on HIT panel  Vinnie LevelBenjamin Jaidin Richison, PharmD., BCPS Clinical Pharmacist Pager [phone]

## 2014-09-09 NOTE — Plan of Care (Signed)
Problem: Phase III - Recovery through Discharge Goal: Activity Progressed Outcome: Not Progressing Bedrest related to feet. Will need PT to work with upper extremities and try up with lift

## 2014-09-09 NOTE — Progress Notes (Addendum)
[facility] [place]  Subjective  - Patient states he does not want an amputation of any kind.   Objective 89/58 80 98.4 F (36.9 C) (Oral) 12 100%  Intake/Output Summary (Last 24 hours) at 09/09/14 0801 Last data filed at 09/09/14 0700  Gross per 24 hour  Intake 1247.5 ml  Output    975 ml  Net  272.5 ml    DP doppler signal weak on right and biphasic on left Right forefoot mottling, decreased edema, left foot decreased edema mottling subsided Left radial signal weak with dusky thumb, but has cap refill and sensation is intact   Assessment/Planning: atheroembolic event Right forefoot cool and dusky weak doppler DP Left resolving edema biphasic DP doppler signal Right BKA was discussed and patient is refusing   HIT panel pending  Clinton GallantCOLLINS, EMMA [facility] LLCMAUREEN 09/09/2014 8:01 AM -- Less pain than yesterday.  Distal 2/3 of foot mottled right side.  Most of right foot is insensate.  Left foot dusky plantar but sensation intact and overall improved Left thumb continues to improve.  Continue to observe for now.  Most likely will need amputation of some or all of right foot but pt wishes to avoid amputation for now.  Fabienne Brunsharles Elizet Kaplan, MD [facility] [place] Office: [phone] Pager: [phone]  Laboratory Lab Results:  Recent Labs  09/08/14 0342 09/09/14 0330  WBC 15.7* 15.7*  HGB 9.9* 11.1*  HCT 28.9* 32.3*  PLT 144* 144*   BMET  Recent Labs  09/07/14 0323 09/08/14 0342  NA 140 138  K 3.9 3.9  CL 104 104  CO2 26 24  GLUCOSE 99 77  BUN 31* 32*  CREATININE 1.40* 1.41*  CALCIUM 8.5 7.7*    COAG Lab Results  Component Value Date   INR 1.50* 09/24/2014   INR 1.16 07/31/2014   INR 1.13 08/07/2014   No results found for: PTT

## 2014-09-09 NOTE — Progress Notes (Signed)
PT Cancellation Note  Patient Details Name: Darren HarrisonRichard M [name] MRN: [id number] DOB: 12/19/1940   Cancelled Treatment:    Reason Eval/Treat Not Completed: Pain limiting ability to participate (pt still deciding on amputation and denying therapy at this time until decision made. Please advise of weightbearing orders. Thanks)   Toney Sangabor, Devany Aja Beth 09/09/2014, 7:52 AM Delaney MeigsMaija Tabor Margo Lama, PT [phone]

## 2014-09-10 LAB — BASIC METABOLIC PANEL
Anion gap: 12 (ref 5–15)
BUN: 30 mg/dL — AB (ref 6–23)
CALCIUM: 7.8 mg/dL — AB (ref 8.4–10.5)
CO2: 24 mmol/L (ref 19–32)
CREATININE: 1.11 mg/dL (ref 0.50–1.35)
Chloride: 102 mmol/L (ref 96–112)
GFR calc non Af Amer: 64 mL/min — ABNORMAL LOW (ref >90)
GFR, EST AFRICAN AMERICAN: 74 mL/min — AB (ref >90)
Glucose, Bld: 104 mg/dL — ABNORMAL HIGH (ref 70–99)
Potassium: 3.1 mmol/L — ABNORMAL LOW (ref 3.5–5.1)
Sodium: 138 mmol/L (ref 135–145)

## 2014-09-10 LAB — GLUCOSE, CAPILLARY
GLUCOSE-CAPILLARY: 107 mg/dL — AB (ref 70–99)
Glucose-Capillary: 96 mg/dL (ref 70–99)
Glucose-Capillary: 117 mg/dL — ABNORMAL HIGH (ref 70–99)
Glucose-Capillary: 102 mg/dL — ABNORMAL HIGH (ref 70–99)

## 2014-09-10 LAB — CBC
HEMATOCRIT: 29.8 % — AB (ref 39.0–52.0)
Hemoglobin: 10 g/dL — ABNORMAL LOW (ref 13.0–17.0)
MCH: 30 pg (ref 26.0–34.0)
MCHC: 33.6 g/dL (ref 30.0–36.0)
MCV: 89.5 fL (ref 78.0–100.0)
Platelets: 171 10*3/uL (ref 150–400)
RBC: 3.33 MIL/uL — ABNORMAL LOW (ref 4.22–5.81)
RDW: 15.4 % (ref 11.5–15.5)
WBC: 11.3 10*3/uL — ABNORMAL HIGH (ref 4.0–10.5)

## 2014-09-10 LAB — POTASSIUM: Potassium: 3.8 mmol/L (ref 3.5–5.1)

## 2014-09-10 LAB — SEROTONIN RELEASE ASSAY (SRA)
SRA 100IU/mL UFH Ser-aCnc: <1 % (ref 0–20)
SRA, LOW DOSE HEPARIN: 85 % — AB (ref 0–20)

## 2014-09-10 LAB — APTT: APTT: 66 s — AB (ref 24–37)

## 2014-09-10 LAB — HEPARIN INDUCED THROMBOCYTOPENIA PNL: HEPARIN INDUCED PLT AB: 2.948 {OD_unit} — AB (ref 0.000–0.400)

## 2014-09-10 MED ORDER — ASPIRIN EC 81 MG PO TBEC
81.0000 mg | DELAYED_RELEASE_TABLET | Freq: Every day | ORAL | Status: DC
  Administered 2014-09-10 – 2014-09-12 (×3): 81 mg via ORAL
  Filled 2014-09-10 (×3): qty 1

## 2014-09-10 MED ORDER — POTASSIUM CHLORIDE 10 MEQ/50ML IV SOLN
10.0000 meq | INTRAVENOUS | Status: AC | PRN
  Administered 2014-09-10 (×3): 10 meq via INTRAVENOUS
  Filled 2014-09-10: qty 50

## 2014-09-10 MED ORDER — FUROSEMIDE 40 MG PO TABS
40.0000 mg | ORAL_TABLET | Freq: Every day | ORAL | Status: DC
  Administered 2014-09-11 – 2014-09-12 (×2): 40 mg via ORAL
  Filled 2014-09-10 (×3): qty 1

## 2014-09-10 MED ORDER — POTASSIUM CHLORIDE CRYS ER 20 MEQ PO TBCR
40.0000 meq | EXTENDED_RELEASE_TABLET | Freq: Two times a day (BID) | ORAL | Status: AC
  Administered 2014-09-10: 40 meq via ORAL
  Filled 2014-09-10: qty 2

## 2014-09-10 MED ORDER — ASPIRIN 81 MG PO CHEW: 81.0000 mg | CHEWABLE_TABLET | Freq: Every day | ORAL | Status: DC

## 2014-09-10 NOTE — Progress Notes (Signed)
12 Days Post-Op Procedure(s) (LRB): CORONARY ARTERY BYPASS GRAFTING (CABG), ON PUMP, TIMES FOUR, USING LEFT INTERNAL MAMMARY ARTERY, RIGHT GREATER SAPHENOUS VEIN HARVESTED ENDOSCOPICALLY (N/A) MITRAL VALVE REPAIR (MVR) (N/A) TRANSESOPHAGEAL ECHOCARDIOGRAM (TEE) (N/A) Subjective: Feels better today No pain in right foot  Objective: Vital signs in last 24 hours: Temp:  [97.5 F (36.4 C)-99 F (37.2 C)] 98.5 F (36.9 C) (03/15 0400) Pulse Rate:  [86-95] 93 (03/15 0700) Cardiac Rhythm:  [-] Normal sinus rhythm (03/15 0400) Resp:  [13-20] 15 (03/15 0700) BP: (90-122)/(55-91) 103/71 mmHg (03/15 0700) SpO2:  [93 %-100 %] 100 % (03/15 0700) Weight:  [141 lb 12.1 oz (64.3 kg)] 141 lb 12.1 oz (64.3 kg) (03/15 0500)  Hemodynamic parameters for last 24 hours:    Intake/Output from previous day: 03/14 0701 - 03/15 0700 In: 1299.7 [P.O.:900; I.V.:349.7; IV Piggyback:50] Out: 1811 [Urine:1810; Stool:1] Intake/Output this shift:    General appearance: alert, cooperative and no distress Neurologic: intact Heart: regular rate and rhythm Lungs: diminished breath sounds bibasilar Abdomen: normal findings: soft, non-tender Extremities: right foot demarcated, not moving toes, forefoot insensate  Lab Results:  Recent Labs  09/09/14 0330 09/10/14 0300  WBC 15.7* 11.3*  HGB 11.1* 10.0*  HCT 32.3* 29.8*  PLT 144* 171   BMET:  Recent Labs  09/08/14 0342 09/10/14 0300  NA 138 138  K 3.9 3.1*  CL 104 102  CO2 24 24  GLUCOSE 77 104*  BUN 32* 30*  CREATININE 1.41* 1.11  CALCIUM 7.7* 7.8*    PT/INR: No results for input(s): LABPROT, INR in the last 72 hours. ABG    Component Value Date/Time   PHART 7.449 08/30/2014 0139   HCO3 20.5 08/30/2014 0139   TCO2 17 08/30/2014 1709   ACIDBASEDEF 3.0* 08/30/2014 0139   O2SAT 76.6 09/06/2014 1510   CBG (last 3)   Recent Labs  09/09/14 1221 09/09/14 1651 09/09/14 2210  GLUCAP 104* 150* 80    Assessment/Plan: S/P Procedure(s)  (LRB): CORONARY ARTERY BYPASS GRAFTING (CABG), ON PUMP, TIMES FOUR, USING LEFT INTERNAL MAMMARY ARTERY, RIGHT GREATER SAPHENOUS VEIN HARVESTED ENDOSCOPICALLY (N/A) MITRAL VALVE REPAIR (MVR) (N/A) TRANSESOPHAGEAL ECHOCARDIOGRAM (TEE) (N/A) -  CV- stable, si having some PVCs in setting of hypokalemia ' RESP- bilateral effusions, continue diuresis  RENAL- creatinine back to normal, hypokalemia, severe- replete K  ENDO- CBG Ok  HITT - assay +, heparin added to allergy list  On bivalrudin  His right foot is more clearly demarcating. He is reluctant to consider amputation but I think it will be necessary   LOS: 21 days    Darren SlotSteven C Shakiyla Kook 09/10/2014

## 2014-09-10 NOTE — Progress Notes (Signed)
Please call if we can help 

## 2014-09-10 NOTE — Progress Notes (Addendum)
  [facility] Progress Note  09/10/2014 10:33 AM 12 Days Post-Op  Subjective:  Pain improved today in feet and left thumb. Still does not want amputation.  Filed Vitals:   09/10/14 0927  BP:   Pulse:   Temp: 97.8 F (36.6 C)  Resp:     Physical Exam: Extremities:  Dusky left thumb. Sensation intact/motor intact. Moving toes bilaterally. Right foot without sensation and is demarcating. Left foot warm. Doppler signals dorsalis pedis bilaterally.   CBC    Component Value Date/Time   WBC 11.3* 09/10/2014 0300   RBC 3.33* 09/10/2014 0300   HGB 10.0* 09/10/2014 0300   HCT 29.8* 09/10/2014 0300   PLT 171 09/10/2014 0300   MCV 89.5 09/10/2014 0300   MCH 30.0 09/10/2014 0300   MCHC 33.6 09/10/2014 0300   RDW 15.4 09/10/2014 0300   LYMPHSABS 1.6 08/18/2014 2052   MONOABS 0.5 08/08/2014 2052   EOSABS 0.0 08/03/2014 2052   BASOSABS 0.0 07/31/2014 2052    BMET    Component Value Date/Time   NA 138 09/10/2014 0300   K 3.1* 09/10/2014 0300   CL 102 09/10/2014 0300   CO2 24 09/10/2014 0300   GLUCOSE 104* 09/10/2014 0300   BUN 30* 09/10/2014 0300   CREATININE 1.11 09/10/2014 0300   CALCIUM 7.8* 09/10/2014 0300   GFRNONAA 64* 09/10/2014 0300   GFRAA 74* 09/10/2014 0300    INR    Component Value Date/Time   INR 1.50* 09/23/2014 1510     Intake/Output Summary (Last 24 hours) at 09/10/14 1033 Last data filed at 09/10/14 0800  Gross per 24 hour  Intake 1027.21 ml  Output   1700 ml  Net -672.79 ml     Assessment:  74 y.o. male with ischemic right foot. S/p CABG, MVR, TEE 12 Days Post-Op  Plan: -Right foot pain improved today. Insensate. Starting to demarcate. Will likely need amputation but patient is still refusing.  -Left foot with sensation intact and duskiness improved.  -Left thumb pain improved.  -Will continue to monitor.   Maris BergerKimberly Trinh, PA-C [facility] Office: [phone] Pager: [phone] 09/10/2014 10:33  AM     Agree with above.  Now with blister over lateral dorsal foot.   Most likely foot is not salvageable but not pressed to do amputation currently.  Not septic and not really in severe pain.  HIT profile still not back.  Would continue angiomax for now.  Will recheck again later in the week.  Fabienne Brunsharles Meklit Cotta, MD [facility] [place] Office: [phone] Pager: [phone]

## 2014-09-10 NOTE — Progress Notes (Signed)
Physical Therapy Treatment Patient Details Name: Darren HarrisonRichard M [name] MRN: [id number] DOB: 01/21/1941 Today's Date: 09/10/2014    History of Present Illness 74 y.o. male smoker with HTN, HLD, GERD, CVA, CKD (baseline Cr 1.6), with L3 compression fracture s/p kyphoplasty on 2/10and history of MR who presents with new onset CHF and NSTEMI. S/p CABG 03/03, 3/14pt with decreased circulation Right foot in need of amputation but currently refusing    PT Comments    Pt with decreased mobility secondary to decreased circulation bil LE in need of amputation but not yet agreeable. RN note for NWB and bedrest with lift to chair. Pt unable to tolerate feet in dependent position, maxisky not charged and no other lift on unit. Limited to bed mobility and HEP today with RN to lift pt to chair later today after lift charged. Will continue to follow with decreased frequency.   Follow Up Recommendations  SNF     Equipment Recommendations       Recommendations for Other Services       Precautions / Restrictions Precautions Precautions: Sternal;Fall Restrictions Weight Bearing Restrictions: Yes RLE Weight Bearing: Non weight bearing LLE Weight Bearing: Non weight bearing    Mobility  Bed Mobility Overal bed mobility: Needs Assistance Bed Mobility: Rolling;Sidelying to Sit;Sit to Sidelying Rolling: Min assist Sidelying to sit: Mod assist     Sit to sidelying: Mod assist General bed mobility comments: pt rolled right x 3, left x 2 with rail and assist to rotate pelvis and position legs, Pt transferred to sitting but unable to tolerate RLE dependent more than a few seconds and returned to supine  Transfers Overall transfer level:  (maxi sky not charged for transfer OOB)                  Ambulation/Gait                 Stairs            Wheelchair Mobility    Modified Rankin (Stroke Patients Only)       Balance Overall balance assessment: Needs assistance    Sitting balance-Leahy Scale: Poor                              Cognition Arousal/Alertness: Awake/alert Behavior During Therapy: WFL for tasks assessed/performed Overall Cognitive Status: Impaired/Different from baseline Area of Impairment: Following commands;Safety/judgement;Problem solving     Memory: Decreased recall of precautions;Decreased short-term memory Following Commands: Follows one step commands consistently            Exercises General Exercises - Upper Extremity Shoulder Flexion: AROM;Both;10 reps;Supine General Exercises - Lower Extremity Short Arc Quad: AROM;Both;10 reps;Supine Heel Slides: AAROM;Both;10 reps;Supine Hip ABduction/ADduction: AAROM;Both;10 reps;Supine    General Comments        Pertinent Vitals/Pain Pain Location: right foot 6/10 with mobility Pain Descriptors / Indicators: Throbbing Pain Intervention(s): Repositioned    Home Living                      Prior Function            PT Goals (current goals can now be found in the care plan section) Acute Rehab PT Goals Time For Goal Achievement: 09/24/14 Potential to Achieve Goals: Fair Progress towards PT goals: Goals downgraded-see care plan    Frequency       PT Plan Current plan remains appropriate    Co-evaluation  End of Session Equipment Utilized During Treatment: Oxygen Activity Tolerance: Patient tolerated treatment well Patient left: in bed;with call bell/phone within reach;with nursing/sitter in room     Time: 1610-9604 PT Time Calculation (min) (ACUTE ONLY): 27 min  Charges:  $Therapeutic Exercise: 8-22 mins $Therapeutic Activity: 8-22 mins                    G Codes:      Delorse Lek 09/26/2014, 11:00 AM Delaney Meigs, PT [phone]

## 2014-09-10 NOTE — Progress Notes (Signed)
Sent K+ level per order

## 2014-09-10 NOTE — Progress Notes (Signed)
Patient ID: Darren [name], male   DOB: 05/13/1941, 74 y.o.   MRN: [id number]  SICU Evening Rounds:  Hemodynamically stable  K 3.1 this am and given 3 runs. Will repeat this pm.

## 2014-09-10 NOTE — Anesthesia Postprocedure Evaluation (Signed)
  Anesthesia Post-op Note  Patient: Darren [name]  Procedure(s) Performed: Procedure(s): CORONARY ARTERY BYPASS GRAFTING (CABG), ON PUMP, TIMES FOUR, USING LEFT INTERNAL MAMMARY ARTERY, RIGHT GREATER SAPHENOUS VEIN HARVESTED ENDOSCOPICALLY (N/A) MITRAL VALVE REPAIR (MVR) (N/A) TRANSESOPHAGEAL ECHOCARDIOGRAM (TEE) (N/A)  Patient Location: SICU  Anesthesia Type:General  Level of Consciousness: sedated and Patient remains intubated per anesthesia plan  Airway and Oxygen Therapy: Patient remains intubated per anesthesia plan and Patient placed on Ventilator (see vital sign flow sheet for setting)  Post-op Pain: none  Post-op Assessment: Post-op Vital signs reviewed and Patient's Cardiovascular Status Stable  Post-op Vital Signs: stable  Last Vitals:  Filed Vitals:   09/10/14 0927  BP:   Pulse:   Temp: 36.6 C  Resp:     Complications: No apparent anesthesia complications

## 2014-09-10 NOTE — Progress Notes (Signed)
ANTICOAGULATION CONSULT NOTE - Follow Up Consult  Pharmacy Consult for Bivalirudin Indication: potential HIT  Allergies  Allergen Reactions  . 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  Assessment: 74 yo male s/p CABG with confirmed positive HIT assay   AM PTT remains therapeutic at 66. No bleeding or complications noted. Hgb 10. Platelet count count continues to rise. RN reports no s/s of bleeding    Goal of Therapy:  aPTT 50-85 seconds Monitor platelets by anticoagulation protocol: Yes   Plan:   Continue Bivalirudin at 0.12 mg/kg/hr.  Daily aPTT and CBC.    Vinnie LevelBenjamin Sayde Lish, PharmD., BCPS Clinical Pharmacist Pager [phone]

## 2014-09-10 NOTE — Clinical Social Work Note (Signed)
CSW faxed out patient's updated clinicals to SNFs, CSW continuing to follow patient's progress.  Ervin KnackEric R. Maryanne Huneycutt, MSW, Theresia MajorsLCSWA [phone] 09/10/2014 4:28 PM

## 2014-09-11 LAB — BASIC METABOLIC PANEL
ANION GAP: 6 (ref 5–15)
BUN: 22 mg/dL (ref 6–23)
CALCIUM: 8.1 mg/dL — AB (ref 8.4–10.5)
CO2: 28 mmol/L (ref 19–32)
Chloride: 103 mmol/L (ref 96–112)
Creatinine, Ser: 0.96 mg/dL (ref 0.50–1.35)
GFR calc Af Amer: >90 mL/min (ref >90)
GFR calc non Af Amer: 80 mL/min — ABNORMAL LOW (ref >90)
Glucose, Bld: 90 mg/dL (ref 70–99)
Potassium: 3.5 mmol/L (ref 3.5–5.1)
SODIUM: 137 mmol/L (ref 135–145)

## 2014-09-11 LAB — CBC
HEMATOCRIT: 28.9 % — AB (ref 39.0–52.0)
Hemoglobin: 9.6 g/dL — ABNORMAL LOW (ref 13.0–17.0)
MCH: 29 pg (ref 26.0–34.0)
MCHC: 33.2 g/dL (ref 30.0–36.0)
MCV: 87.3 fL (ref 78.0–100.0)
PLATELETS: 221 10*3/uL (ref 150–400)
RBC: 3.31 MIL/uL — ABNORMAL LOW (ref 4.22–5.81)
RDW: 15.2 % (ref 11.5–15.5)
WBC: 9.2 10*3/uL (ref 4.0–10.5)

## 2014-09-11 LAB — GLUCOSE, CAPILLARY
GLUCOSE-CAPILLARY: 89 mg/dL (ref 70–99)
Glucose-Capillary: 82 mg/dL (ref 70–99)
Glucose-Capillary: 136 mg/dL — ABNORMAL HIGH (ref 70–99)

## 2014-09-11 LAB — APTT: aPTT: 69 seconds — ABNORMAL HIGH (ref 24–37)

## 2014-09-11 MED ORDER — POTASSIUM CHLORIDE CRYS ER 20 MEQ PO TBCR
40.0000 meq | EXTENDED_RELEASE_TABLET | Freq: Two times a day (BID) | ORAL | Status: AC
  Administered 2014-09-11 (×2): 40 meq via ORAL
  Filled 2014-09-11 (×2): qty 2

## 2014-09-11 NOTE — Progress Notes (Signed)
Patient ID: Darren [name], male   DOB: 10/06/1940, 74 y.o.   MRN: [id number] EVENING ROUNDS NOTE :     [address]       [place] [id number]             [phone]                 13 Days Post-Op Procedure(s) (LRB): CORONARY ARTERY BYPASS GRAFTING (CABG), ON PUMP, TIMES FOUR, USING LEFT INTERNAL MAMMARY ARTERY, RIGHT GREATER SAPHENOUS VEIN HARVESTED ENDOSCOPICALLY (N/A) MITRAL VALVE REPAIR (MVR) (N/A) TRANSESOPHAGEAL ECHOCARDIOGRAM (TEE) (N/A)  Total Length of Stay:  LOS: 22 days  BP 96/68 mmHg  Pulse 97  Temp(Src) 98.4 F (36.9 C) (Oral)  Resp 18  Ht 5\' 7"  (1.702 m)  Wt 142 lb 6.7 oz (64.6 kg)  BMI 22.30 kg/m2  SpO2 100%  .Intake/Output      03/15 0701 - 03/16 0700 03/16 0701 - 03/17 0700   P.O. 400 250   I.V. (mL/kg) 540 (8.4) 175 (2.7)   IV Piggyback 50    Total Intake(mL/kg) 990 (15.3) 425 (6.6)   Urine (mL/kg/hr) 796 (0.5) 150 (0.2)   Stool 0 (0) 0 (0)   Total Output 796 150   Net +194 +275        Urine Occurrence 1 x 1 x   Stool Occurrence 1 x 1 x     . sodium chloride Stopped (08/31/14 0901)  . sodium chloride Stopped (08/30/14 2200)  . sodium chloride 10 mL/hr at 09/11/14 0400  . bivalirudin (ANGIOMAX) infusion 0.5 mg/mL (Non-ACS indications) 0.12 mg/kg/hr (09/11/14 1400)  . phenylephrine (NEO-SYNEPHRINE) Adult infusion 5 mcg/min (08/30/14 1000)     Lab Results  Component Value Date   WBC 9.2 09/11/2014   HGB 9.6* 09/11/2014   HCT 28.9* 09/11/2014   PLT 221 09/11/2014   GLUCOSE 90 09/11/2014   ALT 58* 09/06/2014   AST 77* 09/06/2014   NA 137 09/11/2014   K 3.5 09/11/2014   CL 103 09/11/2014   CREATININE 0.96 09/11/2014   BUN 22 09/11/2014   CO2 28 09/11/2014   TSH 3.373 08/20/2014   INR 1.50* March 03, 2015   HGBA1C 5.6 08/28/2014   Cr normal, right no worse then two days ago, left foot and right thumb better  Delight OvensEdward B Onetta Spainhower MD  Beeper [phone] Office [phone] 09/11/2014 5:02 PM

## 2014-09-11 NOTE — Progress Notes (Signed)
ANTICOAGULATION CONSULT NOTE - Follow Up Consult  Pharmacy Consult for Bivalirudin Indication: potential HIT  Allergies  Allergen Reactions  . 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 Hgb 9.6. Platelet count count continues to rise. RN reports no s/s of bleeding    Goal of Therapy:  aPTT 50-85 seconds Monitor platelets by anticoagulation protocol: Yes   Plan:   Continue Bivalirudin at 0.12 mg/kg/hr.  Daily aPTT and CBC.    Vinnie LevelBenjamin Solene Hereford, PharmD., BCPS Clinical Pharmacist Pager [phone]

## 2014-09-11 NOTE — Progress Notes (Signed)
13 Days Post-Op Procedure(s) (LRB): CORONARY ARTERY BYPASS GRAFTING (CABG), ON PUMP, TIMES FOUR, USING LEFT INTERNAL MAMMARY ARTERY, RIGHT GREATER SAPHENOUS VEIN HARVESTED ENDOSCOPICALLY (N/A) MITRAL VALVE REPAIR (MVR) (N/A) TRANSESOPHAGEAL ECHOCARDIOGRAM (TEE) (N/A) Subjective: Says he doesn't feel well this AM, but no specific complaint  Objective: Vital signs in last 24 hours: Temp:  [97.8 F (36.6 C)-98.4 F (36.9 C)] 98.3 F (36.8 C) (03/16 0725) Pulse Rate:  [52-112] 94 (03/16 0800) Cardiac Rhythm:  [-] Normal sinus rhythm (03/16 0800) Resp:  [13-24] 17 (03/16 0800) BP: (90-135)/(63-95) 106/76 mmHg (03/16 0800) SpO2:  [98 %-100 %] 100 % (03/16 0800) Weight:  [142 lb 6.7 oz (64.6 kg)] 142 lb 6.7 oz (64.6 kg) (03/16 0500)  Hemodynamic parameters for last 24 hours:    Intake/Output from previous day: 03/15 0701 - 03/16 0700 In: 990 [P.O.:400; I.V.:540; IV Piggyback:50] Out: 796 [Urine:796] Intake/Output this shift: Total I/O In: 25 [I.V.:25] Out: 150 [Urine:150]  General appearance: alert, cooperative and no distress Neurologic: left sided weakness unchanged Heart: regular rate and rhythm Lungs: diminished breath sounds bibasilar Extremities: right foot new blister on lateral dorsum, moving foot better today Wound: clean and dry  Lab Results:  Recent Labs  09/10/14 0300 09/11/14 0500  WBC 11.3* 9.2  HGB 10.0* 9.6*  HCT 29.8* 28.9*  PLT 171 221   BMET:  Recent Labs  09/10/14 0300 09/10/14 2043 09/11/14 0500  NA 138  --  137  K 3.1* 3.8 3.5  CL 102  --  103  CO2 24  --  28  GLUCOSE 104*  --  90  BUN 30*  --  22  CREATININE 1.11  --  0.96  CALCIUM 7.8*  --  8.1*    PT/INR: No results for input(s): LABPROT, INR in the last 72 hours. ABG    Component Value Date/Time   PHART 7.449 08/30/2014 0139   HCO3 20.5 08/30/2014 0139   TCO2 17 08/30/2014 1709   ACIDBASEDEF 3.0* 08/30/2014 0139   O2SAT 76.6 09/06/2014 1510   CBG (last 3)   Recent Labs  09/10/14 1655 09/10/14 2157 09/11/14 0723  GLUCAP 117* 102* 82    Assessment/Plan: S/P Procedure(s) (LRB): CORONARY ARTERY BYPASS GRAFTING (CABG), ON PUMP, TIMES FOUR, USING LEFT INTERNAL MAMMARY ARTERY, RIGHT GREATER SAPHENOUS VEIN HARVESTED ENDOSCOPICALLY (N/A) MITRAL VALVE REPAIR (MVR) (N/A) TRANSESOPHAGEAL ECHOCARDIOGRAM (TEE) (N/A) -  CV- stable  RESP- bilateral effusions- recheck CXR tomorrow  RENAL- hypokalemia better will give additional K PO today  ARF resolved  ENDO- CBG well controlled  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

## 2014-09-12 ENCOUNTER — Inpatient Hospital Stay (HOSPITAL_COMMUNITY): Payer: Medicare Other

## 2014-09-12 LAB — POCT I-STAT 3, ART BLOOD GAS (G3+)
Acid-base deficit: 5 mmol/L — ABNORMAL HIGH (ref 0.0–2.0)
Bicarbonate: 19.2 mEq/L — ABNORMAL LOW (ref 20.0–24.0)
O2 SAT: 95 %
Patient temperature: 98.6
TCO2: 20 mmol/L (ref 0–100)
pCO2 arterial: 33.2 mmHg — ABNORMAL LOW (ref 35.0–45.0)
pH, Arterial: 7.37 (ref 7.350–7.450)
pO2, Arterial: 76 mmHg — ABNORMAL LOW (ref 80.0–100.0)

## 2014-09-12 LAB — CBC
HCT: 29.8 % — ABNORMAL LOW (ref 39.0–52.0)
Hemoglobin: 9.9 g/dL — ABNORMAL LOW (ref 13.0–17.0)
MCH: 29.8 pg (ref 26.0–34.0)
MCHC: 33.2 g/dL (ref 30.0–36.0)
MCV: 89.8 fL (ref 78.0–100.0)
PLATELETS: 237 10*3/uL (ref 150–400)
RBC: 3.32 MIL/uL — ABNORMAL LOW (ref 4.22–5.81)
RDW: 15.5 % (ref 11.5–15.5)
WBC: 8.6 10*3/uL (ref 4.0–10.5)

## 2014-09-12 LAB — BASIC METABOLIC PANEL
Anion gap: 8 (ref 5–15)
BUN: 23 mg/dL (ref 6–23)
CO2: 27 mmol/L (ref 19–32)
CREATININE: 1.05 mg/dL (ref 0.50–1.35)
Calcium: 8 mg/dL — ABNORMAL LOW (ref 8.4–10.5)
Chloride: 104 mmol/L (ref 96–112)
GFR calc Af Amer: 79 mL/min — ABNORMAL LOW (ref >90)
GFR calc non Af Amer: 68 mL/min — ABNORMAL LOW (ref >90)
GLUCOSE: 101 mg/dL — AB (ref 70–99)
Potassium: 3.9 mmol/L (ref 3.5–5.1)
Sodium: 139 mmol/L (ref 135–145)

## 2014-09-12 LAB — APTT: aPTT: 49 seconds — ABNORMAL HIGH (ref 24–37)

## 2014-09-12 LAB — GLUCOSE, CAPILLARY
GLUCOSE-CAPILLARY: 84 mg/dL (ref 70–99)
GLUCOSE-CAPILLARY: 90 mg/dL (ref 70–99)

## 2014-09-12 MED ORDER — IPRATROPIUM-ALBUTEROL 0.5-2.5 (3) MG/3ML IN SOLN
3.0000 mL | Freq: Four times a day (QID) | RESPIRATORY_TRACT | Status: DC
  Administered 2014-09-12: 3 mL via RESPIRATORY_TRACT
  Filled 2014-09-12: qty 3

## 2014-09-12 MED ORDER — WARFARIN - PHYSICIAN DOSING INPATIENT: Freq: Every day | Status: DC

## 2014-09-12 MED ORDER — WARFARIN SODIUM 2.5 MG PO TABS
2.5000 mg | ORAL_TABLET | Freq: Every day | ORAL | Status: DC
  Filled 2014-09-12: qty 1

## 2014-09-12 MED ORDER — FUROSEMIDE 40 MG PO TABS: 40.0000 mg | ORAL_TABLET | Freq: Two times a day (BID) | ORAL | Status: DC

## 2014-09-27 NOTE — Progress Notes (Signed)
14 Days Post-Op Procedure(s) (LRB): CORONARY ARTERY BYPASS GRAFTING (CABG), ON PUMP, TIMES FOUR, USING LEFT INTERNAL MAMMARY ARTERY, RIGHT GREATER SAPHENOUS VEIN HARVESTED ENDOSCOPICALLY (N/A) MITRAL VALVE REPAIR (MVR) (N/A) TRANSESOPHAGEAL ECHOCARDIOGRAM (TEE) (N/A) Subjective: No complaints but daughter has noticed he is wheezing  Objective: Vital signs in last 24 hours: Temp:  [97.7 F (36.5 C)-98.4 F (36.9 C)] 98 F (36.7 C) (03/17 1156) Pulse Rate:  [42-106] 106 (03/17 1100) Cardiac Rhythm:  [-] Sinus tachycardia (03/17 1000) Resp:  [16-30] 16 (03/17 1100) BP: (91-130)/(53-102) 120/92 mmHg (03/17 1100) SpO2:  [94 %-100 %] 98 % (03/17 1100) Weight:  [142 lb 3.2 oz (64.5 kg)] 142 lb 3.2 oz (64.5 kg) (03/17 0500)  Hemodynamic parameters for last 24 hours:    Intake/Output from previous day: 03/16 0701 - 03/17 0700 In: 1080 [P.O.:480; I.V.:600] Out: 725 [Urine:725] Intake/Output this shift: Total I/O In: 103.8 [I.V.:103.8] Out: 25 [Urine:25]  General appearance: alert and no distress Neurologic: left sided weakness Heart: regular rate and rhythm Lungs: wheezes bilaterally Abdomen: normal findings: soft, non-tender Extremities: no change RLE  Lab Results:  Recent Labs  09/11/14 0500 09/02/2014 0300  WBC 9.2 8.6  HGB 9.6* 9.9*  HCT 28.9* 29.8*  PLT 221 237   BMET:  Recent Labs  09/11/14 0500 09/22/2014 0300  NA 137 139  K 3.5 3.9  CL 103 104  CO2 28 27  GLUCOSE 90 101*  BUN 22 23  CREATININE 0.96 1.05  CALCIUM 8.1* 8.0*    PT/INR: No results for input(s): LABPROT, INR in the last 72 hours. ABG    Component Value Date/Time   PHART 7.449 08/30/2014 0139   HCO3 20.5 08/30/2014 0139   TCO2 17 08/30/2014 1709   ACIDBASEDEF 3.0* 08/30/2014 0139   O2SAT 76.6 09/06/2014 1510   CBG (last 3)   Recent Labs  09/11/14 1257 09/11/14 1545 09/21/2014 0732  GLUCAP 136* 89 84    Assessment/Plan: S/P Procedure(s) (LRB): CORONARY ARTERY BYPASS GRAFTING  (CABG), ON PUMP, TIMES FOUR, USING LEFT INTERNAL MAMMARY ARTERY, RIGHT GREATER SAPHENOUS VEIN HARVESTED ENDOSCOPICALLY (N/A) MITRAL VALVE REPAIR (MVR) (N/A) TRANSESOPHAGEAL ECHOCARDIOGRAM (TEE) (N/A) -  CV- stable, maintaining SR with PACs and PVCs  HITT- needs BKA but refusing right now  Start coumadin in order to transition off bivalrudin  RESP- bilateral effusions- still ~20 pounds above preop- increase lasix  Wheezing- continue symbicort, change albuterol/ atrovent to BID  RENAL- lytes, creatinine OK, continue diuresis  Malnutrition- poor PO intake- supplements/ encourage PO  Deconditioning- limited due to prior stroke, right foot ischemia and poor motivation  Will likely need long term care   LOS: 23 days    Loreli SlotSteven C Levii Hairfield 09/04/2014

## 2014-09-27 NOTE — Progress Notes (Signed)
ANTICOAGULATION CONSULT NOTE - Follow Up Consult  Pharmacy Consult for Bivalirudin Indication: HIT   Allergies  Allergen Reactions  . Heparin     HIT positive 09/03/14    Patient Measurements: Height: 5\' 7"  (170.2 cm) Weight: 142 lb 3.2 oz (64.5 kg) IBW/kg (Calculated) : 66.1 Heparin Dosing Weight: 65.4 kg  Labs:  Recent Labs  09/10/14 0300 09/11/14 0500 09/10/2014 0300  HGB 10.0* 9.6* 9.9*  HCT 29.8* 28.9* 29.8*  PLT 171 221 237  APTT 66* 69* 49*  CREATININE 1.11 0.96 1.05    Estimated Creatinine Clearance: 57.2 mL/min (by C-G formula based on Cr of 1.05).  Assessment: 74 yo male s/p CABG with confirmed positive HIT assay and SRA  AM PTT back to sub-therapeutic level of 49. Likely due to improvement in renal fx. RN states Angiomax infusion was not held for any reason and no s/s of bleeding. Hgb low but stable. Platelet count count continues to rise.     Goal of Therapy:  aPTT 50-85 seconds Monitor platelets by anticoagulation protocol: Yes   Plan:   Increase Bivalirudin to 0.14 mg/kg/hr. F/u 4-6 hr aPTT   Monitor daily aPTT, CBC and s/s of bleeding .    Vinnie LevelBenjamin Lazarius Rivkin, PharmD., BCPS Clinical Pharmacist Pager [phone]

## 2014-09-27 NOTE — Progress Notes (Signed)
Chaplain responded to code.   Family and doctor bedside, more are arriving.   Family is consoling one another and speaking with the doctor.   Page if needed.   Gala RomneyBrown, Devyon Keator J, Chaplain 08/31/2014 [phone] After 5pm [phone]

## 2014-09-27 NOTE — Progress Notes (Signed)
Physical Therapy Treatment Patient Details Name: Darren HarrisonRichard M Calma MRN: [id number] DOB: 01/21/1941 Today's Date: 09/13/2014    History of Present Illness 74 y.o. male smoker with HTN, HLD, GERD, CVA, CKD (baseline Cr 1.6), with L3 compression fracture s/p kyphoplasty on 2/10and history of MR who presents with new onset CHF and NSTEMI. S/p CABG 03/03, 3/14pt with decreased circulation Right foot in need of amputation but currently refusing    PT Comments    Pt with sats on RA on arrival 95 with wide fluctuation throughout session 86-96% with sats not maintained below 90%. Pt incontinent of stool on arrival with assist for pericare, linen change mobility with lift to chair. Pt educated for sternal precautions with assist to roll but able to maintain sidelying without assist. Will continue to follow with recommendation for lift to chair daily with nursing. Pt with tendency for position of sway with right hip externally rotated and left hip internally rotated with encouragement for increased positioning at midline.  Follow Up Recommendations  SNF     Equipment Recommendations       Recommendations for Other Services       Precautions / Restrictions Precautions Precautions: Sternal;Fall Precaution Comments: currently no standing secondary to vascular issues bil LE Restrictions Weight Bearing Restrictions: Yes RLE Weight Bearing: Non weight bearing LLE Weight Bearing: Non weight bearing    Mobility  Bed Mobility Overal bed mobility: Needs Assistance Bed Mobility: Rolling Rolling: Min assist         General bed mobility comments: pt rolled bil x 2 for pericare, linen change and lift pad positioning.   Transfers                 General transfer comment: [place] transfer bed to chair with +2 assist for lines and pt safety with pt bil LE supported with increased knee extension to limit dependent pain  Ambulation/Gait                 Stairs             Wheelchair Mobility    Modified Rankin (Stroke Patients Only)       Balance                                    Cognition Arousal/Alertness: Awake/alert   Overall Cognitive Status: Impaired/Different from baseline Area of Impairment: Following commands;Orientation     Memory: Decreased recall of precautions;Decreased short-term memory Following Commands: Follows one step commands consistently            Exercises General Exercises - Lower Extremity Heel Slides: AAROM;Both;10 reps;Seated Hip ABduction/ADduction: AAROM;Both;10 reps;Seated    General Comments        Pertinent Vitals/Pain Pain Assessment: No/denies pain    Home Living                      Prior Function            PT Goals (current goals can now be found in the care plan section) Progress towards PT goals: Not progressing toward goals - comment (due to faitgue, incontinence)    Frequency       PT Plan Current plan remains appropriate    Co-evaluation             End of Session   Activity Tolerance: Patient tolerated treatment well Patient left: in chair;with call bell/phone within reach;with nursing/sitter  in room     Time: 0748-0818 PT Time Calculation (min) (ACUTE ONLY): 30 min  Charges:  $Therapeutic Activity: 23-37 mins                    G Codes:      Delorse Lek 17-Sep-2014, 9:50 AM Delaney Meigs, PT [phone]

## 2014-09-27 NOTE — Clinical Social Work Note (Signed)
CSW contacted patient's daughter Astrid DraftsRhonda Miller and discussed discharge plan for patient.  CSW was informed that patient's daughter stated they are going to need long term care for patient, because they can no longer take care of him at home.  CSW informed patient's daughter that updated clinicals will be faxed out to SNF and CSW will give updated bed offers tomorrow morning.  Ervin KnackEric R. Kwanza Cancelliere, MSW, Theresia MajorsLCSWA [phone] 09/19/2014 5:24 PM

## 2014-09-27 NOTE — Discharge Summary (Signed)
      [address]       [place] [id number]             [phone]      Mr. Lapierre arrested at around 4 PM. He had recently eaten lunch. He was seen struggling to breathe. He then had a respiratory arrest followed by a VT arrest. CPR was started. He converted to SR without cardioversion. He was profoundly hypotensive and received multiple boluses of epinephrine. Dr. Cornelius Moraswen was present at the time. [facility] was a [facility] at the time of the arrest. I arrived at approximately 4:20. He was in Sinus brady and profoundly hypotensive. We continued to resuscitate with epinephrine. He would respond transiently but rapidly lose any palpable pulse.  His daughter was in contact by telephone. Mr. [name] had told her this morning that he didn't want any more interventions and wished to be DNR. Due to the overwhelming odds against meaningful survival in this situation and in accordance with his wishes, we established that we would not do any CPR or shocks.  When his family arrived and clearly expressed his wishes in person , we elected not to continue resuscitation attempts. He rapidly lost his pulse but had evidence of PEA. At 1700 he became asystolic. The ETT was removed and mechanical ventilation discontinued.   He was pronounced dead at 1700. His family was in attendance. Probable cause of death was aspiration.

## 2014-09-27 NOTE — Progress Notes (Signed)
NUTRITION FOLLOW UP  Intervention:   -Continue Ensure Complete po BID, each supplement provides 350 kcal and 13 grams of protein  -Magic cup TID with meals, each supplement provides 290 kcal and 9 grams of protein  Nutrition Dx:   Inadequate oral intake related to recent surgery, decreased appetite as evidenced by PO< 50%; ongoing  Goal:   Intake to meet >90% of estimated nutrition needs; unmet  Monitor:   PO intake, labs, weight trend.  Assessment:   Patient presented to the hospital on 2/22 with c/o SOB and leg swelling. 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  Per MD notes pt is HIT positive and needs right BKA however pt is refusing.  Nutrition intake has been poor 0-15% of his meals.  Per pt he is drinking 2 ensures every day but not willing to drink any more than that.  Family at bedside and confirms pt is not eating but a few bites at each meal.  Discussed magic cup, pt willing to try, family plans to encourage.    Height: Ht Readings from Last 1 Encounters:  08/20/14 5\' 7"  (1.702 m)    Weight Status:   Wt Readings from Last 1 Encounters:  09/17/2014 142 lb 3.2 oz (64.5 kg)   08/30/14 142 lb 10.2 oz (64.7 kg)       Re-estimated needs:  Kcal: 1800-2000 Protein: 90-100 grams Fluid: 1.8-2.0 L  Skin: closed surgical incisions to chest and rt leg  Diet Order: Diet Heart   Intake/Output Summary (Last 24 hours) at 08/28/2014 1056 Last data filed at 08/28/2014 0900  Gross per 24 hour  Intake    905 ml  Output    575 ml  Net    330 ml    Last BM: 3/17  Labs:   Recent Labs Lab 09/10/14 0300 09/10/14 2043 09/11/14 0500 09/16/2014 0300  NA 138  --  137 139  K 3.1* 3.8 3.5 3.9  CL 102  --  103 104  CO2 24  --  28 27  BUN 30*  --  22 23  CREATININE 1.11  --  0.96 1.05  CALCIUM 7.8*  --  8.1* 8.0*  GLUCOSE 104*  --  90 101*    CBG (last 3)   Recent Labs  09/11/14 1257 09/11/14 1545 09/17/2014 0732  GLUCAP 136* 89 84    Scheduled Meds: . aspirin EC  81 mg Oral Daily   Or  . aspirin  81 mg Per Tube Daily  . atorvastatin  80 mg Oral q1800  . bisacodyl  10 mg Oral Daily   Or  . bisacodyl  10 mg Rectal Daily  . budesonide-formoterol  2 puff Inhalation BID  . colchicine  0.6 mg Oral BID  . docusate sodium  200 mg Oral Daily  . feeding supplement (ENSURE COMPLETE)  237 mL Oral BID BM  . furosemide  40 mg Oral Daily  . insulin aspart  0-9 Units Subcutaneous TID WC  . pantoprazole  40 mg Oral Daily  . sodium chloride  10-40 mL Intracatheter Q12H  . sodium chloride  3 mL Intravenous Q12H  . thiamine  100 mg Oral Daily    Continuous Infusions: . sodium chloride Stopped (08/31/14 0901)  . sodium chloride Stopped (08/30/14 2200)  . sodium chloride 10 mL/hr at 09/10/2014 0400  . bivalirudin (ANGIOMAX) infusion 0.5 mg/mL (Non-ACS indications) 0.14 mg/kg/hr (08/29/2014 0930)  . phenylephrine (NEO-SYNEPHRINE) Adult infusion 5 mcg/min (08/30/14 1000)    Kendell BaneHeather Menno Vanbergen RD, LDN, CNSC [phone]  Pager [phone] After Hours Pager

## 2014-09-27 NOTE — Consult Note (Addendum)
[facility]   Subjective  - foot is so so   Objective 119/85 98 97.7 F (36.5 C) (Oral) 23 97%  Intake/Output Summary (Last 24 hours) at 09/22/2014 16100823 Last data filed at 09/17/2014 0700  Gross per 24 hour  Intake   1055 ml  Output    575 ml  Net    480 ml   Right foot mottled from heel down, doppler signals Left foot dusky on plantar aspect, doppler signals Left hand essentially back to baseline  Assessment/Planning: Pt at this point still refusing amputation Will recheck next week if pt still inpt HIT was positive and most likely explains this event Continue angiomax while inpt. Will need to weigh risk benefit of short term oral anticoagulation  FIELDS,CHARLES E 09/26/2014 8:23 AM --  Laboratory Lab Results:  Recent Labs  09/11/14 0500 09/14/2014 0300  WBC 9.2 8.6  HGB 9.6* 9.9*  HCT 28.9* 29.8*  PLT 221 237   BMET  Recent Labs  09/11/14 0500 09/15/2014 0300  NA 137 139  K 3.5 3.9  CL 103 104  CO2 28 27  GLUCOSE 90 101*  BUN 22 23  CREATININE 0.96 1.05  CALCIUM 8.1* 8.0*    COAG Lab Results  Component Value Date   INR 1.50* 17-Apr-2015   INR 1.16 08/07/2014   INR 1.13 08/07/2014   No results found for: PTT

## 2014-09-27 DEATH — deceased

## 2016-02-20 ENCOUNTER — Encounter (HOSPITAL_COMMUNITY): Payer: Self-pay | Admitting: *Deleted

## 2016-11-17 IMAGING — CR DG CHEST 1V PORT
1 series · 1 of 1 positions shown · non-contrast
Comparison: 08/30/2014

CLINICAL DATA: Shortness of breath. Congestive heart failure.
Postop from CABG and mitral valve repair.

EXAM:
PORTABLE CHEST - 1 VIEW

[AP]
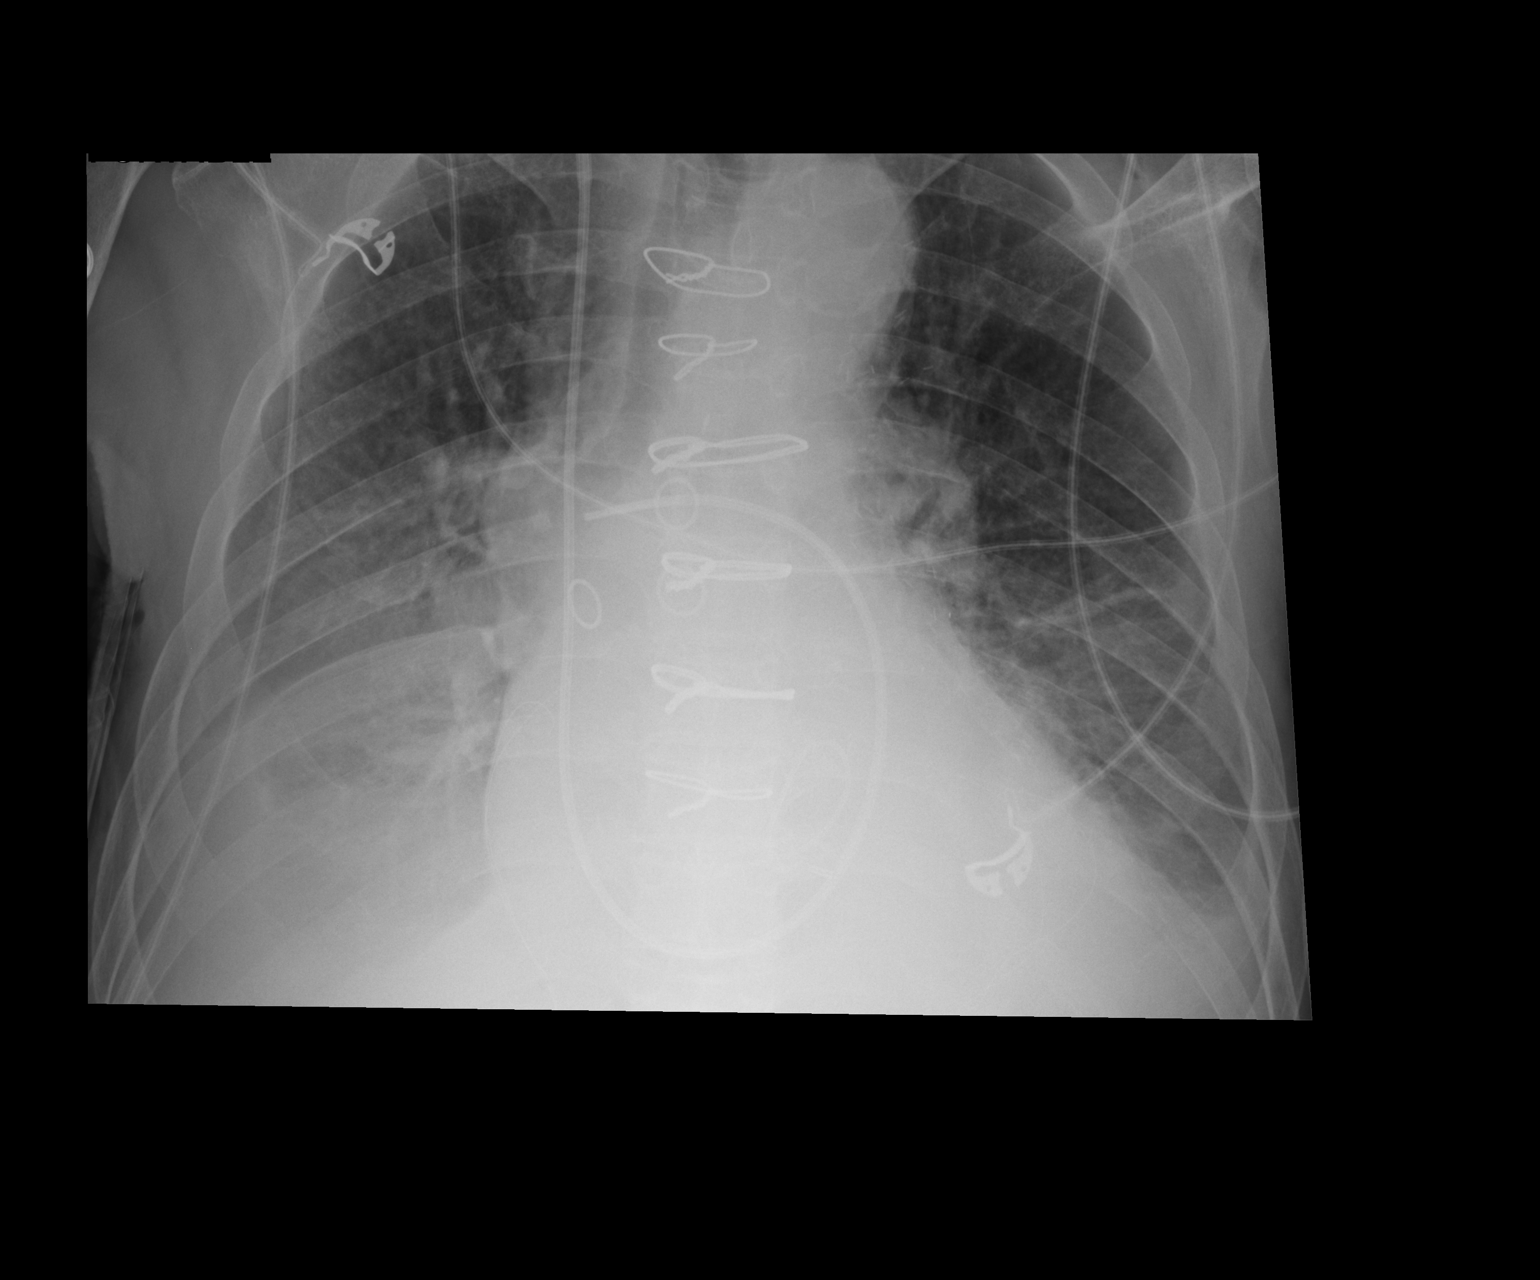

[1 of 1 positions shown; findings below may reference images not displayed]

FINDINGS: Swan-Ganz catheter remains in place with tip in the central right
pulmonary artery. No evidence of pneumothorax. Left chest tube and
mediastinal drains have been removed since previous study.

Mild cardiomegaly and diffuse interstitial edema pattern remains
stable. Increased right pleural effusion and right lower lobe
atelectasis demonstrated. Persistent collapse or consolidation seen
in left retrocardiac lung base, with probable small left pleural
effusion.
IMPRESSION: Increased right pleural effusion and right lower lobe atelectasis.

Stable left retrocardiac collapse or consolidation and probable
small left pleural effusion.

No significant change in cardiomegaly and diffuse interstitial edema
pattern.
